# Patient Record
Sex: Female | Born: 1970 | Race: Black or African American | Hispanic: No | Marital: Married | Smoking: Never smoker
Health system: Southern US, Community
[De-identification: ages and names within clinical notes are randomized; demographics above are authoritative.]

## PROBLEM LIST (undated history)

## (undated) ENCOUNTER — Inpatient Hospital Stay (HOSPITAL_COMMUNITY): Payer: Self-pay

## (undated) DIAGNOSIS — I10 Essential (primary) hypertension: Secondary | ICD-10-CM

## (undated) DIAGNOSIS — K219 Gastro-esophageal reflux disease without esophagitis: Secondary | ICD-10-CM

## (undated) DIAGNOSIS — Z789 Other specified health status: Secondary | ICD-10-CM

## (undated) HISTORY — DX: Essential (primary) hypertension: I10

## (undated) HISTORY — DX: Gastro-esophageal reflux disease without esophagitis: K21.9

---

## 2015-06-25 ENCOUNTER — Other Ambulatory Visit (HOSPITAL_COMMUNITY): Payer: Self-pay | Admitting: Nurse Practitioner

## 2015-06-25 DIAGNOSIS — O09522 Supervision of elderly multigravida, second trimester: Secondary | ICD-10-CM

## 2015-06-25 DIAGNOSIS — Z3689 Encounter for other specified antenatal screening: Secondary | ICD-10-CM

## 2015-06-25 DIAGNOSIS — Z3A18 18 weeks gestation of pregnancy: Secondary | ICD-10-CM

## 2015-06-25 LAB — OB RESULTS CONSOLE RPR: RPR: NONREACTIVE

## 2015-06-25 LAB — OB RESULTS CONSOLE HEPATITIS B SURFACE ANTIGEN: HEP B S AG: NEGATIVE

## 2015-06-25 LAB — OB RESULTS CONSOLE GC/CHLAMYDIA
CHLAMYDIA, DNA PROBE: NEGATIVE
Gonorrhea: NEGATIVE

## 2015-06-25 LAB — OB RESULTS CONSOLE ABO/RH: RH Type: POSITIVE

## 2015-06-25 LAB — OB RESULTS CONSOLE RUBELLA ANTIBODY, IGM: RUBELLA: IMMUNE

## 2015-06-25 LAB — OB RESULTS CONSOLE HIV ANTIBODY (ROUTINE TESTING): HIV: NONREACTIVE

## 2015-06-25 LAB — OB RESULTS CONSOLE ANTIBODY SCREEN: ANTIBODY SCREEN: NEGATIVE

## 2015-07-07 ENCOUNTER — Ambulatory Visit (HOSPITAL_COMMUNITY): Payer: Self-pay

## 2015-07-14 ENCOUNTER — Other Ambulatory Visit (HOSPITAL_COMMUNITY): Payer: Self-pay | Admitting: Nurse Practitioner

## 2015-07-14 DIAGNOSIS — O09522 Supervision of elderly multigravida, second trimester: Secondary | ICD-10-CM

## 2015-07-14 DIAGNOSIS — Z3A18 18 weeks gestation of pregnancy: Secondary | ICD-10-CM

## 2015-07-14 DIAGNOSIS — Z3689 Encounter for other specified antenatal screening: Secondary | ICD-10-CM

## 2015-07-17 ENCOUNTER — Ambulatory Visit (HOSPITAL_COMMUNITY)
Admission: RE | Admit: 2015-07-17 | Discharge: 2015-07-17 | Disposition: A | Discharge status: Home / Self Care | Payer: 59 | Source: Ambulatory Visit | Attending: Nurse Practitioner | Admitting: Nurse Practitioner

## 2015-07-17 ENCOUNTER — Other Ambulatory Visit (HOSPITAL_COMMUNITY): Payer: Self-pay | Admitting: Nurse Practitioner

## 2015-07-17 ENCOUNTER — Encounter (HOSPITAL_COMMUNITY): Payer: Self-pay

## 2015-07-17 DIAGNOSIS — Z3A21 21 weeks gestation of pregnancy: Secondary | ICD-10-CM | POA: Insufficient documentation

## 2015-07-17 DIAGNOSIS — O09529 Supervision of elderly multigravida, unspecified trimester: Secondary | ICD-10-CM

## 2015-07-17 DIAGNOSIS — Z3492 Encounter for supervision of normal pregnancy, unspecified, second trimester: Secondary | ICD-10-CM

## 2015-07-17 DIAGNOSIS — O09522 Supervision of elderly multigravida, second trimester: Secondary | ICD-10-CM | POA: Insufficient documentation

## 2015-07-17 DIAGNOSIS — Z3A18 18 weeks gestation of pregnancy: Secondary | ICD-10-CM

## 2015-07-17 DIAGNOSIS — Z3689 Encounter for other specified antenatal screening: Secondary | ICD-10-CM

## 2015-07-17 DIAGNOSIS — O34219 Maternal care for unspecified type scar from previous cesarean delivery: Secondary | ICD-10-CM

## 2015-07-17 DIAGNOSIS — O280 Abnormal hematological finding on antenatal screening of mother: Secondary | ICD-10-CM | POA: Insufficient documentation

## 2015-07-17 DIAGNOSIS — Z315 Encounter for genetic counseling: Secondary | ICD-10-CM | POA: Diagnosis not present

## 2015-07-17 NOTE — Progress Notes (Signed)
Genetic Counseling  High-Risk Gestation Note  Appointment Date:  07/17/2015 Referred By: Alberteen Spindle, NP Date of Birth:  1971/03/15 Partner:  Jenna Greene   Pregnancy History: [id number] Estimated Date of Delivery: 11/25/15 Estimated Gestational Age: [redacted]w[redacted]d Attending: Ledon Snare, MD   Ms. Jenna Greene and her husband, Mr. Jenna Greene, were seen for genetic counseling because of a maternal age of 44 y.o..   Mr. Jenna Greene assisted with interpretation during today's visit, but Jenna Greene indicated that she understood English better than she could speak it.   In Summary:  Discussed Advanced maternal age and increased risk for fetal aneuploidy  Detailed ultrasound performed today changed EDD to 11/25/15  Patient previously had Quad screen, which was recalculated using corrected EDD; Result now screen positive for Down syndrome (1 in 148) but decreased from patient's age-risk  Visualized fetal anatomy reportedly normal on today's ultrasound  Patient declined NIPS and amniocentesis  They were counseled regarding maternal age and the association with risk for chromosome conditions due to nondisjunction with aging of the ova.   We reviewed chromosomes, nondisjunction, and the associated 1 in 22 risk for fetal aneuploidy related to a maternal age of 44 y.o. at [redacted]w[redacted]d gestation.  They were counseled that the risk for aneuploidy decreases as gestational age increases, accounting for those pregnancies which spontaneously abort.  We specifically discussed Down syndrome (trisomy 80), trisomies 70 and 42, and sex chromosome aneuploidies (47,XXX and 47,XXY) including the common features and prognoses of each.   Detailed ultrasound was performed at the time of today's visit. Visualized fetal anatomy appeared normal. The patient's EDD was changed to 11/25/15 based on today's ultrasound exam. Complete ultrasound results reported separately. We reviewed the benefits and limitations of ultrasound as a screening tool  for fetal aneuploidy. They understand that ultrasound cannot diagnose or rule out chromosome conditions and does not identify or rule out all birth defects.   Jenna Greene previously had Quad screening performed through [facility], facilitated through her OB provider. The screen was recalculated today with the changed estimated date of delivery, and the result was reported as screen positive for Down syndrome. However, the adjusted risk is still less than the patient's age-related risk (1 in 42 to 1 in 148). We reviewed that screening tests are used to modify a patient's a priori risk for aneuploidy, typically based on age. This estimate provides a pregnancy specific risk assessment. They understand that this screen is not diagnostic for these conditions and does not assess for all chromosome conditions.   We reviewed the additional available screening option of noninvasive prenatal screening (NIPS)/cell free DNA (cfDNA) testing.  They were counseled that screening tests are used to modify a patient's a priori risk for aneuploidy, typically based on age. This estimate provides a pregnancy specific risk assessment. We reviewed the benefits and limitations of each option. Specifically, we discussed the conditions for which each test screens, the detection rates, and false positive rates of each. They were also counseled regarding diagnostic testing via amniocentesis. We reviewed the approximate 1 in 300-500 risk for complications for amniocentesis, including spontaneous pregnancy loss. After consideration of all the options, they declined NIPS and amniocentesis, stating they were comfortable with the risk assessment from ultrasound and Quad screen. They understand that screening tests cannot rule out all birth defects or genetic syndromes. The patient was advised of this limitation and states she still does not want additional testing at this time.   Jenna Greene was provided  with written  information regarding sickle cell anemia (SCA) including the carrier frequency and incidence in the African population, the availability of carrier testing and prenatal diagnosis if indicated.  In addition, we discussed that hemoglobinopathies are routinely screened for as part of the [facility] newborn screening panel.  Hemoglobin electrophoresis was previously performed and was normal through her OB provider.  Both family histories were reviewed and found to be noncontributory for birth defects, intellectual disability, and known genetic conditions. Without further information regarding the provided family history, an accurate genetic risk cannot be calculated. Further genetic counseling is warranted if more information is obtained.  Jenna Greene denied exposure to environmental toxins or chemical agents. She denied the use of alcohol, tobacco or street drugs. She denied significant viral illnesses during the course of her pregnancy. Her medical and surgical histories were noncontributory.   I counseled this couple regarding the above risks and available options.  The approximate face-to-face time with the genetic counselor was 40 minutes.  Quinn Plowman, MS,  Certified Genetic Counselor 07/17/2015

## 2015-07-31 ENCOUNTER — Ambulatory Visit
Admission: RE | Admit: 2015-07-31 | Discharge: 2015-07-31 | Disposition: A | Discharge status: Home / Self Care | Payer: No Typology Code available for payment source | Source: Ambulatory Visit | Attending: Infectious Disease | Admitting: Infectious Disease

## 2015-07-31 ENCOUNTER — Other Ambulatory Visit: Payer: Self-pay | Admitting: Infectious Disease

## 2015-07-31 DIAGNOSIS — R7611 Nonspecific reaction to tuberculin skin test without active tuberculosis: Secondary | ICD-10-CM

## 2015-08-21 ENCOUNTER — Other Ambulatory Visit (HOSPITAL_COMMUNITY): Payer: Self-pay | Admitting: Physician Assistant

## 2015-08-21 DIAGNOSIS — O09523 Supervision of elderly multigravida, third trimester: Secondary | ICD-10-CM

## 2015-08-21 DIAGNOSIS — Z3A28 28 weeks gestation of pregnancy: Secondary | ICD-10-CM

## 2015-09-04 ENCOUNTER — Ambulatory Visit (HOSPITAL_COMMUNITY)
Admission: RE | Admit: 2015-09-04 | Discharge: 2015-09-04 | Disposition: A | Discharge status: Home / Self Care | Payer: 59 | Source: Ambulatory Visit | Attending: Physician Assistant | Admitting: Physician Assistant

## 2015-09-04 ENCOUNTER — Other Ambulatory Visit (HOSPITAL_COMMUNITY): Payer: Self-pay | Admitting: Physician Assistant

## 2015-09-04 VITALS — BP 117/74 | HR 90 | Wt 210.0 lb

## 2015-09-04 DIAGNOSIS — O34219 Maternal care for unspecified type scar from previous cesarean delivery: Secondary | ICD-10-CM

## 2015-09-04 DIAGNOSIS — Z3A28 28 weeks gestation of pregnancy: Secondary | ICD-10-CM

## 2015-09-04 DIAGNOSIS — O09529 Supervision of elderly multigravida, unspecified trimester: Secondary | ICD-10-CM

## 2015-09-04 DIAGNOSIS — O09523 Supervision of elderly multigravida, third trimester: Secondary | ICD-10-CM

## 2015-09-04 DIAGNOSIS — O3421 Maternal care for scar from previous cesarean delivery: Secondary | ICD-10-CM | POA: Insufficient documentation

## 2015-09-22 ENCOUNTER — Encounter (HOSPITAL_COMMUNITY): Payer: Self-pay | Admitting: *Deleted

## 2015-09-28 ENCOUNTER — Encounter: Payer: Self-pay | Admitting: *Deleted

## 2015-09-30 ENCOUNTER — Ambulatory Visit (HOSPITAL_COMMUNITY)
Admission: RE | Admit: 2015-09-30 | Discharge: 2015-09-30 | Disposition: A | Discharge status: Home / Self Care | Payer: 59 | Source: Ambulatory Visit | Attending: Physician Assistant | Admitting: Physician Assistant

## 2015-09-30 ENCOUNTER — Encounter (HOSPITAL_COMMUNITY): Payer: Self-pay

## 2015-09-30 ENCOUNTER — Other Ambulatory Visit (HOSPITAL_COMMUNITY): Payer: Self-pay | Admitting: Maternal and Fetal Medicine

## 2015-09-30 DIAGNOSIS — Z3A32 32 weeks gestation of pregnancy: Secondary | ICD-10-CM

## 2015-09-30 DIAGNOSIS — O34219 Maternal care for unspecified type scar from previous cesarean delivery: Secondary | ICD-10-CM | POA: Diagnosis not present

## 2015-09-30 DIAGNOSIS — Z36 Encounter for antenatal screening of mother: Secondary | ICD-10-CM | POA: Insufficient documentation

## 2015-09-30 DIAGNOSIS — O289 Unspecified abnormal findings on antenatal screening of mother: Secondary | ICD-10-CM | POA: Diagnosis present

## 2015-09-30 DIAGNOSIS — O09523 Supervision of elderly multigravida, third trimester: Secondary | ICD-10-CM

## 2015-09-30 DIAGNOSIS — O09529 Supervision of elderly multigravida, unspecified trimester: Secondary | ICD-10-CM

## 2015-10-26 ENCOUNTER — Inpatient Hospital Stay (HOSPITAL_COMMUNITY)
Admission: AD | Admit: 2015-10-26 | Discharge: 2015-10-26 | Disposition: A | Discharge status: Home / Self Care | Payer: 59 | Source: Ambulatory Visit | Attending: Family Medicine | Admitting: Family Medicine

## 2015-10-26 ENCOUNTER — Encounter (HOSPITAL_COMMUNITY): Payer: Self-pay | Admitting: *Deleted

## 2015-10-26 DIAGNOSIS — O368130 Decreased fetal movements, third trimester, not applicable or unspecified: Secondary | ICD-10-CM | POA: Diagnosis not present

## 2015-10-26 DIAGNOSIS — O09523 Supervision of elderly multigravida, third trimester: Secondary | ICD-10-CM

## 2015-10-26 DIAGNOSIS — Z3A25 25 weeks gestation of pregnancy: Secondary | ICD-10-CM | POA: Diagnosis not present

## 2015-10-26 HISTORY — DX: Other specified health status: Z78.9

## 2015-10-26 NOTE — Discharge Instructions (Signed)
Fetal Movement Counts  Patient Name: __________________________________________________ Patient Due Date: ____________________  Performing a fetal movement count is highly recommended in high-risk pregnancies, but it is good for every pregnant woman to do. Your health care provider may ask you to start counting fetal movements at 28 weeks of the pregnancy. Fetal movements often increase:  · After eating a full meal.  · After physical activity.  · After eating or drinking something sweet or cold.  · At rest.  Pay attention to when you feel the baby is most active. This will help you notice a pattern of your baby's sleep and wake cycles and what factors contribute to an increase in fetal movement. It is important to perform a fetal movement count at the same time each day when your baby is normally most active.   HOW TO COUNT FETAL MOVEMENTS  1. Find a quiet and comfortable area to sit or lie down on your left side. Lying on your left side provides the best blood and oxygen circulation to your baby.  2. Write down the day and time on a sheet of paper or in a journal.  3. Start counting kicks, flutters, swishes, rolls, or jabs in a 2-hour period. You should feel at least 10 movements within 2 hours.  4. If you do not feel 10 movements in 2 hours, wait 2-3 hours and count again. Look for a change in the pattern or not enough counts in 2 hours.  SEEK MEDICAL CARE IF:  · You feel less than 10 counts in 2 hours, tried twice.  · There is no movement in over an hour.  · The pattern is changing or taking longer each day to reach 10 counts in 2 hours.  · You feel the baby is not moving as he or she usually does.  Date: ____________ Movements: ____________ Start time: ____________ Finish time: ____________   Date: ____________ Movements: ____________ Start time: ____________ Finish time: ____________  Date: ____________ Movements: ____________ Start time: ____________ Finish time: ____________  Date: ____________ Movements:  ____________ Start time: ____________ Finish time: ____________  Date: ____________ Movements: ____________ Start time: ____________ Finish time: ____________  Date: ____________ Movements: ____________ Start time: ____________ Finish time: ____________  Date: ____________ Movements: ____________ Start time: ____________ Finish time: ____________  Date: ____________ Movements: ____________ Start time: ____________ Finish time: ____________   Date: ____________ Movements: ____________ Start time: ____________ Finish time: ____________  Date: ____________ Movements: ____________ Start time: ____________ Finish time: ____________  Date: ____________ Movements: ____________ Start time: ____________ Finish time: ____________  Date: ____________ Movements: ____________ Start time: ____________ Finish time: ____________  Date: ____________ Movements: ____________ Start time: ____________ Finish time: ____________  Date: ____________ Movements: ____________ Start time: ____________ Finish time: ____________  Date: ____________ Movements: ____________ Start time: ____________ Finish time: ____________   Date: ____________ Movements: ____________ Start time: ____________ Finish time: ____________  Date: ____________ Movements: ____________ Start time: ____________ Finish time: ____________  Date: ____________ Movements: ____________ Start time: ____________ Finish time: ____________  Date: ____________ Movements: ____________ Start time: ____________ Finish time: ____________  Date: ____________ Movements: ____________ Start time: ____________ Finish time: ____________  Date: ____________ Movements: ____________ Start time: ____________ Finish time: ____________  Date: ____________ Movements: ____________ Start time: ____________ Finish time: ____________   Date: ____________ Movements: ____________ Start time: ____________ Finish time: ____________  Date: ____________ Movements: ____________ Start time: ____________ Finish  time: ____________  Date: ____________ Movements: ____________ Start time: ____________ Finish time: ____________  Date: ____________ Movements: ____________ Start time:   ____________ Finish time: ____________  Date: ____________ Movements: ____________ Start time: ____________ Finish time: ____________  Date: ____________ Movements: ____________ Start time: ____________ Finish time: ____________  Date: ____________ Movements: ____________ Start time: ____________ Finish time: ____________   Date: ____________ Movements: ____________ Start time: ____________ Finish time: ____________  Date: ____________ Movements: ____________ Start time: ____________ Finish time: ____________  Date: ____________ Movements: ____________ Start time: ____________ Finish time: ____________  Date: ____________ Movements: ____________ Start time: ____________ Finish time: ____________  Date: ____________ Movements: ____________ Start time: ____________ Finish time: ____________  Date: ____________ Movements: ____________ Start time: ____________ Finish time: ____________  Date: ____________ Movements: ____________ Start time: ____________ Finish time: ____________   Date: ____________ Movements: ____________ Start time: ____________ Finish time: ____________  Date: ____________ Movements: ____________ Start time: ____________ Finish time: ____________  Date: ____________ Movements: ____________ Start time: ____________ Finish time: ____________  Date: ____________ Movements: ____________ Start time: ____________ Finish time: ____________  Date: ____________ Movements: ____________ Start time: ____________ Finish time: ____________  Date: ____________ Movements: ____________ Start time: ____________ Finish time: ____________  Date: ____________ Movements: ____________ Start time: ____________ Finish time: ____________   Date: ____________ Movements: ____________ Start time: ____________ Finish time: ____________  Date: ____________  Movements: ____________ Start time: ____________ Finish time: ____________  Date: ____________ Movements: ____________ Start time: ____________ Finish time: ____________  Date: ____________ Movements: ____________ Start time: ____________ Finish time: ____________  Date: ____________ Movements: ____________ Start time: ____________ Finish time: ____________  Date: ____________ Movements: ____________ Start time: ____________ Finish time: ____________  Date: ____________ Movements: ____________ Start time: ____________ Finish time: ____________   Date: ____________ Movements: ____________ Start time: ____________ Finish time: ____________  Date: ____________ Movements: ____________ Start time: ____________ Finish time: ____________  Date: ____________ Movements: ____________ Start time: ____________ Finish time: ____________  Date: ____________ Movements: ____________ Start time: ____________ Finish time: ____________  Date: ____________ Movements: ____________ Start time: ____________ Finish time: ____________  Date: ____________ Movements: ____________ Start time: ____________ Finish time: ____________     This information is not intended to replace advice given to you by your health care provider. Make sure you discuss any questions you have with your health care provider.     Document Released: 12/21/2006 Document Revised: 12/12/2014 Document Reviewed: 09/17/2012  Elsevier Interactive Patient Education ©2016 [employer]

## 2015-10-26 NOTE — MAU Note (Addendum)
Patient sent by [facility] at [redacted] weeks gestation following a nonreactive NST in the office; Fetus active; denies pain, bleeding or discharge. BPP= 8/8.

## 2015-10-26 NOTE — MAU Provider Note (Signed)
  History     CSN: [id number]  Arrival date and time: 10/26/15 1210   First Provider Initiated Contact with Patient 10/26/15 1258      Chief Complaint  Patient presents with  . Non-stress Test   HPI Patient states she was a the [facility] today, and they did an NST that was non-reactive on the monitor. They then did a BPP which was 8/8  Patient denies any changes in fetal movement (+). Denies vaginal bleeding, no LOF, endorse braxton-hicks, no constant contractions. Uncomplicated pregnancy except for AMA (age 44).   OB History    Gravida Para Term Preterm AB TAB SAB Ectopic Multiple Living   4 3 3  0 0 0 0 0 0 3      Past Medical History  Diagnosis Date  . Medical history non-contributory     Past Surgical History  Procedure Laterality Date  . Cesarean section      History reviewed. No pertinent family history.  Social History  Substance Use Topics  . Smoking status: Never Smoker   . Smokeless tobacco: None  . Alcohol Use: No    Allergies: No Known Allergies  Prescriptions prior to admission  Medication Sig Dispense Refill Last Dose  . Prenatal Vit-Fe Fumarate-FA (PRENATAL MULTIVITAMIN) TABS tablet Take 1 tablet by mouth daily at 12 noon.       Review of Systems  Constitutional: Negative for fever and chills.  Gastrointestinal: Negative for abdominal pain.       Positive FM.    Physical Exam   Blood pressure 130/81, pulse 108, temperature 97.4 F (36.3 C), temperature source Oral, resp. rate 18, height 5\' 4"  (1.626 m), weight 213 lb 3 oz (96.701 kg), last menstrual period 03/11/2015.  Physical Exam  Constitutional: She is oriented to person, place, and time. She appears well-developed and well-nourished.  Cardiovascular: Normal rate, regular rhythm, normal heart sounds and intact distal pulses.  Exam reveals no gallop and no friction rub.   No murmur heard. Respiratory: Effort normal and breath sounds normal. No respiratory distress. She has no wheezes.   GI: Soft. Bowel sounds are normal. She exhibits no distension. There is no tenderness.  Musculoskeletal: Normal range of motion. She exhibits no edema.  Neurological: She is alert and oriented to person, place, and time.  Skin: Skin is warm and dry.  FHT: FHR: 135-140 bpm, variability: moderate, accelerations: Present, Decelerations: None noted   MAU Course  Procedures   Assessment and Plan   NST: Patient's FHR tracing on monitor was reactive, accelerations x 3. No decelerations noted. No contractions on the monitor.  - Patient was watched for 20 minutes on monitors, reassuring strips - Patient was discharged with adequate follow up  - [facility] >40. In-basket message sent to Dr. Macon LargeAnyanwu.   Danella Maierssiyah Z Mikell 10/26/2015, 12:58 PM   I was present for the exam and agree with above.  [place] Tiwana Chavis, CNM 10/27/2015 4:51 PM

## 2015-10-30 ENCOUNTER — Encounter (HOSPITAL_COMMUNITY): Payer: Self-pay

## 2015-10-30 ENCOUNTER — Ambulatory Visit (HOSPITAL_COMMUNITY)
Admission: RE | Admit: 2015-10-30 | Discharge: 2015-10-30 | Disposition: A | Discharge status: Home / Self Care | Payer: 59 | Source: Ambulatory Visit | Attending: Physician Assistant | Admitting: Physician Assistant

## 2015-10-30 DIAGNOSIS — O368190 Decreased fetal movements, unspecified trimester, not applicable or unspecified: Secondary | ICD-10-CM | POA: Insufficient documentation

## 2015-10-30 DIAGNOSIS — Z3A00 Weeks of gestation of pregnancy not specified: Secondary | ICD-10-CM | POA: Insufficient documentation

## 2015-11-12 ENCOUNTER — Inpatient Hospital Stay (HOSPITAL_COMMUNITY)
Admission: AD | Admit: 2015-11-12 | Discharge: 2015-11-12 | Disposition: A | Discharge status: Home / Self Care | Payer: 59 | Source: Ambulatory Visit | Attending: Obstetrics and Gynecology | Admitting: Obstetrics and Gynecology

## 2015-11-12 ENCOUNTER — Inpatient Hospital Stay (HOSPITAL_COMMUNITY): Payer: 59

## 2015-11-12 ENCOUNTER — Encounter (HOSPITAL_COMMUNITY): Payer: Self-pay

## 2015-11-12 DIAGNOSIS — O26893 Other specified pregnancy related conditions, third trimester: Secondary | ICD-10-CM | POA: Insufficient documentation

## 2015-11-12 DIAGNOSIS — O09523 Supervision of elderly multigravida, third trimester: Secondary | ICD-10-CM | POA: Insufficient documentation

## 2015-11-12 DIAGNOSIS — Z3A38 38 weeks gestation of pregnancy: Secondary | ICD-10-CM

## 2015-11-12 DIAGNOSIS — IMO0002 Reserved for concepts with insufficient information to code with codable children: Secondary | ICD-10-CM

## 2015-11-12 DIAGNOSIS — O288 Other abnormal findings on antenatal screening of mother: Secondary | ICD-10-CM

## 2015-11-12 NOTE — Discharge Instructions (Signed)
Fetal Movement Counts Patient Name: __________________________________________________ Patient Due Date: ____________________ Performing a fetal movement count is highly recommended in high-risk pregnancies, but it is good for every pregnant woman to do. Your health care provider may ask you to start counting fetal movements at 28 weeks of the pregnancy. Fetal movements often increase:  After eating a full meal.  After physical activity.  After eating or drinking something sweet or cold.  At rest. Pay attention to when you feel the baby is most active. This will help you notice a pattern of your baby's sleep and wake cycles and what factors contribute to an increase in fetal movement. It is important to perform a fetal movement count at the same time each day when your baby is normally most active.  HOW TO COUNT FETAL MOVEMENTS 1. Find a quiet and comfortable area to sit or lie down on your left side. Lying on your left side provides the best blood and oxygen circulation to your baby. 2. Write down the day and time on a sheet of paper or in a journal. 3. Start counting kicks, flutters, swishes, rolls, or jabs in a 2-hour period. You should feel at least 10 movements within 2 hours. 4. If you do not feel 10 movements in 2 hours, wait 2-3 hours and count again. Look for a change in the pattern or not enough counts in 2 hours. SEEK MEDICAL CARE IF:  You feel less than 10 counts in 2 hours, tried twice.  There is no movement in over an hour.  The pattern is changing or taking longer each day to reach 10 counts in 2 hours.  You feel the baby is not moving as he or she usually does. Date: ____________ Movements: ____________ Start time: ____________ Finish time: ____________  Date: ____________ Movements: ____________ Start time: ____________ Finish time: ____________ Date: ____________ Movements: ____________ Start time: ____________ Finish time: ____________ Date: ____________ Movements:  ____________ Start time: ____________ Finish time: ____________ Date: ____________ Movements: ____________ Start time: ____________ Finish time: ____________ Date: ____________ Movements: ____________ Start time: ____________ Finish time: ____________ Date: ____________ Movements: ____________ Start time: ____________ Finish time: ____________ Date: ____________ Movements: ____________ Start time: ____________ Finish time: ____________  Date: ____________ Movements: ____________ Start time: ____________ Finish time: ____________ Date: ____________ Movements: ____________ Start time: ____________ Finish time: ____________ Date: ____________ Movements: ____________ Start time: ____________ Finish time: ____________ Date: ____________ Movements: ____________ Start time: ____________ Finish time: ____________ Date: ____________ Movements: ____________ Start time: ____________ Finish time: ____________ Date: ____________ Movements: ____________ Start time: ____________ Finish time: ____________ Date: ____________ Movements: ____________ Start time: ____________ Finish time: ____________  Date: ____________ Movements: ____________ Start time: ____________ Finish time: ____________ Date: ____________ Movements: ____________ Start time: ____________ Finish time: ____________ Date: ____________ Movements: ____________ Start time: ____________ Finish time: ____________ Date: ____________ Movements: ____________ Start time: ____________ Finish time: ____________ Date: ____________ Movements: ____________ Start time: ____________ Finish time: ____________ Date: ____________ Movements: ____________ Start time: ____________ Finish time: ____________ Date: ____________ Movements: ____________ Start time: ____________ Finish time: ____________  Date: ____________ Movements: ____________ Start time: ____________ Finish time: ____________ Date: ____________ Movements: ____________ Start time: ____________ Finish  time: ____________ Date: ____________ Movements: ____________ Start time: ____________ Finish time: ____________ Date: ____________ Movements: ____________ Start time: ____________ Finish time: ____________ Date: ____________ Movements: ____________ Start time: ____________ Finish time: ____________ Date: ____________ Movements: ____________ Start time: ____________ Finish time: ____________ Date: ____________ Movements: ____________ Start time: ____________ Finish time: ____________  Date: ____________ Movements: ____________ Start time: ____________ Finish   time: ____________ Date: ____________ Movements: ____________ Start time: ____________ Finish time: ____________ Date: ____________ Movements: ____________ Start time: ____________ Finish time: ____________ Date: ____________ Movements: ____________ Start time: ____________ Finish time: ____________ Date: ____________ Movements: ____________ Start time: ____________ Finish time: ____________ Date: ____________ Movements: ____________ Start time: ____________ Finish time: ____________ Date: ____________ Movements: ____________ Start time: ____________ Finish time: ____________  Date: ____________ Movements: ____________ Start time: ____________ Finish time: ____________ Date: ____________ Movements: ____________ Start time: ____________ Finish time: ____________ Date: ____________ Movements: ____________ Start time: ____________ Finish time: ____________ Date: ____________ Movements: ____________ Start time: ____________ Finish time: ____________ Date: ____________ Movements: ____________ Start time: ____________ Finish time: ____________ Date: ____________ Movements: ____________ Start time: ____________ Finish time: ____________ Date: ____________ Movements: ____________ Start time: ____________ Finish time: ____________  Date: ____________ Movements: ____________ Start time: ____________ Finish time: ____________ Date: ____________  Movements: ____________ Start time: ____________ Finish time: ____________ Date: ____________ Movements: ____________ Start time: ____________ Finish time: ____________ Date: ____________ Movements: ____________ Start time: ____________ Finish time: ____________ Date: ____________ Movements: ____________ Start time: ____________ Finish time: ____________ Date: ____________ Movements: ____________ Start time: ____________ Finish time: ____________ Date: ____________ Movements: ____________ Start time: ____________ Finish time: ____________  Date: ____________ Movements: ____________ Start time: ____________ Finish time: ____________ Date: ____________ Movements: ____________ Start time: ____________ Finish time: ____________ Date: ____________ Movements: ____________ Start time: ____________ Finish time: ____________ Date: ____________ Movements: ____________ Start time: ____________ Finish time: ____________ Date: ____________ Movements: ____________ Start time: ____________ Finish time: ____________ Date: ____________ Movements: ____________ Start time: ____________ Finish time: ____________   This information is not intended to replace advice given to you by your health care provider. Make sure you discuss any questions you have with your health care provider.   Document Released: 12/21/2006 Document Revised: 12/12/2014 Document Reviewed: 09/17/2012 Elsevier Interactive Patient Education 2016 [employer] Braxton Hicks Contractions Contractions of the uterus can occur throughout pregnancy. Contractions are not always a sign that you are in labor.  WHAT ARE BRAXTON HICKS CONTRACTIONS?  Contractions that occur before labor are called Braxton Hicks contractions, or false labor. Toward the end of pregnancy (32-34 weeks), these contractions can develop more often and may become more forceful. This is not true labor because these contractions do not result in opening (dilatation) and thinning of  the cervix. They are sometimes difficult to tell apart from true labor because these contractions can be forceful and people have different pain tolerances. You should not feel embarrassed if you go to the hospital with false labor. Sometimes, the only way to tell if you are in true labor is for your health care provider to look for changes in the cervix. If there are no prenatal problems or other health problems associated with the pregnancy, it is completely safe to be sent home with false labor and await the onset of true labor. HOW CAN YOU TELL THE DIFFERENCE BETWEEN TRUE AND FALSE LABOR? False Labor  The contractions of false labor are usually shorter and not as hard as those of true labor.   The contractions are usually irregular.   The contractions are often felt in the front of the lower abdomen and in the groin.   The contractions may go away when you walk around or change positions while lying down.   The contractions get weaker and are shorter lasting as time goes on.   The contractions do not usually become progressively stronger, regular, and closer together as with true labor.  True Labor 5. Contractions in true   labor last 30-70 seconds, become very regular, usually become more intense, and increase in frequency.  6. The contractions do not go away with walking.  7. The discomfort is usually felt in the top of the uterus and spreads to the lower abdomen and low back.  8. True labor can be determined by your health care provider with an exam. This will show that the cervix is dilating and getting thinner.  WHAT TO REMEMBER  Keep up with your usual exercises and follow other instructions given by your health care provider.   Take medicines as directed by your health care provider.   Keep your regular prenatal appointments.   Eat and drink lightly if you think you are going into labor.   If Braxton Hicks contractions are making you uncomfortable:   Change  your position from lying down or resting to walking, or from walking to resting.   Sit and rest in a tub of warm water.   Drink 2-3 glasses of water. Dehydration may cause these contractions.   Do slow and deep breathing several times an hour.  WHEN SHOULD I SEEK IMMEDIATE MEDICAL CARE? Seek immediate medical care if:  Your contractions become stronger, more regular, and closer together.   You have fluid leaking or gushing from your vagina.   You have a fever.   You pass blood-tinged mucus.   You have vaginal bleeding.   You have continuous abdominal pain.   You have low back pain that you never had before.   You feel your baby's head pushing down and causing pelvic pressure.   Your baby is not moving as much as it used to.    This information is not intended to replace advice given to you by your health care provider. Make sure you discuss any questions you have with your health care provider.   Document Released: 11/21/2005 Document Revised: 11/26/2013 Document Reviewed: 09/02/2013 Elsevier Interactive Patient Education 2016 Elsevier Inc[employer]  

## 2015-11-12 NOTE — MAU Note (Signed)
Urine in lab 

## 2015-11-12 NOTE — MAU Note (Signed)
[facility] because of a non reactive NST and deceleration. Denies pain or bleeding. Reports good fetal movement.

## 2015-11-12 NOTE — MAU Provider Note (Signed)
  History     CSN: [id number]  Arrival date and time: 11/12/15 1625   First Provider Initiated Contact with Patient 11/12/15 1717         Chief Complaint  Patient presents with  . Prolonged Monitoring    HPI Sanjuan Dameraba Rolston is a 44 y.o. [id number] at [id number] who [facility].  [facility] for NST d/t AMA. Prolonged deceleration during NST.  Patient report normal fetal movement. Denies contractions, LOF, or vaginal bleeding.  Scheduled for repeat c/section on 12/14.   OB History    Gravida Para Term Preterm AB TAB SAB Ectopic Multiple Living   4 3 3  0 0 0 0 0 0 3      Past Medical History  Diagnosis Date  . Medical history non-contributory     Past Surgical History  Procedure Laterality Date  . Cesarean section      History reviewed. No pertinent family history.  Social History  Substance Use Topics  . Smoking status: Never Smoker   . Smokeless tobacco: None  . Alcohol Use: No    Allergies:  Allergies  Allergen Reactions  . Pork-Derived Products Other (See Comments)    Prescriptions prior to admission  Medication Sig Dispense Refill Last Dose  . guaiFENesin (ROBITUSSIN) 100 MG/5ML liquid Take 200 mg by mouth 4 (four) times daily as needed for cough.   11/12/2015 at Unknown time  . Prenatal Vit-Fe Fumarate-FA (PRENATAL MULTIVITAMIN) TABS tablet Take 1 tablet by mouth daily at 12 noon.   11/12/2015 at Unknown time  . pseudoephedrine (SUDAFED) 30 MG tablet Take 30-60 mg by mouth every 6 (six) hours as needed for congestion.   11/12/2015 at Unknown time    Review of Systems  Constitutional: Negative.   Gastrointestinal: Negative.   Genitourinary: Negative.    Physical Exam   Blood pressure 116/80, pulse 95, temperature 98 F (36.7 C), temperature source Oral, resp. rate 18, last menstrual period 03/11/2015.  Physical Exam  Constitutional: She appears well-developed and well-nourished. No distress.  HENT:   Head: Normocephalic and atraumatic.  Respiratory: Effort normal. No respiratory distress.  GI: Soft. There is no tenderness.  Skin: She is not diaphoretic.  Psychiatric: She has a normal mood and affect. Her behavior is normal. Judgment and thought content normal.   Fetal Tracing:  Baseline: 135 Variability: moderate Accelerations: 10x10, 15x15 prior to being taken off monitor for discharge Decelerations: none  Toco: irregular   MAU Course  Procedures  MDM Category 1 tracing without decelerations Ultrasound - BPP 8/8, AFI 11 S/w Dr. Jolayne Pantheronstant about BPP & FHT. Ok to discharge home  Assessment and Plan  A: 1. Fetal heart deceleration   2. Non-reactive NST (non-stress test)    P: Discharge home Keep scheduled follow up Discussed reasons to return to MAU Fetal kick count form given  Judeth HornErin Sanoe Hazan, NP  11/12/2015, 4:47 PM

## 2015-11-16 ENCOUNTER — Other Ambulatory Visit (HOSPITAL_COMMUNITY): Payer: 59

## 2015-11-16 ENCOUNTER — Encounter (HOSPITAL_COMMUNITY)
Admission: RE | Admit: 2015-11-16 | Discharge: 2015-11-16 | Disposition: A | Discharge status: Home / Self Care | Payer: 59 | Source: Ambulatory Visit | Attending: Obstetrics & Gynecology | Admitting: Obstetrics & Gynecology

## 2015-11-16 ENCOUNTER — Encounter (HOSPITAL_COMMUNITY): Payer: Self-pay

## 2015-11-16 VITALS — BP 122/80 | HR 102 | Temp 99.0°F | Resp 20 | Ht 63.5 in | Wt 212.0 lb

## 2015-11-16 DIAGNOSIS — O280 Abnormal hematological finding on antenatal screening of mother: Secondary | ICD-10-CM

## 2015-11-16 DIAGNOSIS — O09529 Supervision of elderly multigravida, unspecified trimester: Secondary | ICD-10-CM

## 2015-11-16 LAB — ABO/RH: ABO/RH(D): O POS

## 2015-11-16 LAB — CBC
HCT: 34.5 % — ABNORMAL LOW (ref 36.0–46.0)
Hemoglobin: 11.8 g/dL — ABNORMAL LOW (ref 12.0–15.0)
MCH: 32.1 pg (ref 26.0–34.0)
MCHC: 34.2 g/dL (ref 30.0–36.0)
MCV: 93.8 fL (ref 78.0–100.0)
PLATELETS: 221 10*3/uL (ref 150–400)
RBC: 3.68 MIL/uL — AB (ref 3.87–5.11)
RDW: 13.8 % (ref 11.5–15.5)
WBC: 5.9 10*3/uL (ref 4.0–10.5)

## 2015-11-16 NOTE — Patient Instructions (Signed)
Your procedure is scheduled on:  Wednesday, Dec.  14, 2016  [facility]: 8:45 A.M.  Pick up the phone at the desk and dial [phone].  Call this number if you have problems the morning of surgery: [phone].  Remember: Do NOT eat food or drink after:  Midnight Tuesday Take these medicines the morning of surgery with a SIP OF WATER:  None  Do NOT wear jewelry (body piercing), metal hair clips/bobby pins, or nail polish. Do NOT wear lotions, powders, or perfumes.  You may wear deoderant. Do NOT shave for 48 hours prior to surgery. Do NOT bring valuables to the hospital.  Leave suitcase in car.  After surgery it may be brought to your room.  For patients admitted to the hospital, checkout time is 11:00 AM the day of discharge.

## 2015-11-17 ENCOUNTER — Encounter (HOSPITAL_COMMUNITY): Payer: Self-pay | Admitting: Anesthesiology

## 2015-11-17 LAB — RPR: RPR: NONREACTIVE

## 2015-11-17 NOTE — Anesthesia Preprocedure Evaluation (Addendum)
Anesthesia Evaluation  Patient identified by MRN, date of birth, ID band Patient awake    Reviewed: Allergy & Precautions, NPO status , Patient's Chart, lab work & pertinent test results  Airway Mallampati: III  TM Distance: >3 FB Neck ROM: Full    Dental no notable dental hx. (+) Teeth Intact   Pulmonary neg pulmonary ROS,    Pulmonary exam normal breath sounds clear to auscultation       Cardiovascular negative cardio ROS Normal cardiovascular exam Rhythm:Regular Rate:Normal     Neuro/Psych negative neurological ROS  negative psych ROS   GI/Hepatic Neg liver ROS, GERD  Medicated,  Endo/Other  Obesity  Renal/GU negative Renal ROS     Musculoskeletal negative musculoskeletal ROS (+)   Abdominal (+) + obese,   Peds  Hematology  (+) anemia ,   Anesthesia Other Findings   Reproductive/Obstetrics (+) Pregnancy Previous C/Section x 3 Undesired fertility                            Anesthesia Physical Anesthesia Plan  ASA: II  Anesthesia Plan: Combined Spinal and Epidural   Post-op Pain Management:    Induction:   Airway Management Planned: Natural Airway  Additional Equipment:   Intra-op Plan:   Post-operative Plan:   Informed Consent: I have reviewed the patients History and Physical, chart, labs and discussed the procedure including the risks, benefits and alternatives for the proposed anesthesia with the patient or authorized representative who has indicated his/her understanding and acceptance.     Plan Discussed with: Anesthesiologist, CRNA and Surgeon  Anesthesia Plan Comments:        Anesthesia Quick Evaluation

## 2015-11-18 ENCOUNTER — Encounter (HOSPITAL_COMMUNITY): Payer: Self-pay | Admitting: Anesthesiology

## 2015-11-18 ENCOUNTER — Encounter (HOSPITAL_COMMUNITY)
Admission: AD | Disposition: A | Discharge status: Home / Self Care | Payer: Self-pay | Source: Ambulatory Visit | Attending: Obstetrics & Gynecology

## 2015-11-18 ENCOUNTER — Inpatient Hospital Stay (HOSPITAL_COMMUNITY): Payer: 59 | Admitting: Anesthesiology

## 2015-11-18 ENCOUNTER — Encounter (HOSPITAL_COMMUNITY): Payer: Self-pay | Admitting: Obstetrics & Gynecology

## 2015-11-18 ENCOUNTER — Inpatient Hospital Stay (HOSPITAL_COMMUNITY)
Admission: AD | Admit: 2015-11-18 | Discharge: 2015-11-20 | DRG: 766 | Disposition: A | Discharge status: Home / Self Care | Payer: 59 | Source: Ambulatory Visit | Attending: Obstetrics & Gynecology | Admitting: Obstetrics & Gynecology

## 2015-11-18 DIAGNOSIS — Z302 Encounter for sterilization: Secondary | ICD-10-CM

## 2015-11-18 DIAGNOSIS — O9962 Diseases of the digestive system complicating childbirth: Secondary | ICD-10-CM | POA: Diagnosis not present

## 2015-11-18 DIAGNOSIS — O09523 Supervision of elderly multigravida, third trimester: Secondary | ICD-10-CM

## 2015-11-18 DIAGNOSIS — Z3A39 39 weeks gestation of pregnancy: Secondary | ICD-10-CM

## 2015-11-18 DIAGNOSIS — O34211 Maternal care for low transverse scar from previous cesarean delivery: Principal | ICD-10-CM | POA: Diagnosis present

## 2015-11-18 DIAGNOSIS — Z98891 History of uterine scar from previous surgery: Secondary | ICD-10-CM

## 2015-11-18 DIAGNOSIS — O280 Abnormal hematological finding on antenatal screening of mother: Secondary | ICD-10-CM

## 2015-11-18 DIAGNOSIS — K219 Gastro-esophageal reflux disease without esophagitis: Secondary | ICD-10-CM | POA: Diagnosis present

## 2015-11-18 DIAGNOSIS — O34219 Maternal care for unspecified type scar from previous cesarean delivery: Secondary | ICD-10-CM | POA: Diagnosis present

## 2015-11-18 HISTORY — PX: TUBAL LIGATION: SHX77

## 2015-11-18 LAB — CBC
HEMATOCRIT: 30.5 % — AB (ref 36.0–46.0)
HEMOGLOBIN: 10.4 g/dL — AB (ref 12.0–15.0)
MCH: 32.2 pg (ref 26.0–34.0)
MCHC: 34.1 g/dL (ref 30.0–36.0)
MCV: 94.4 fL (ref 78.0–100.0)
Platelets: 189 10*3/uL (ref 150–400)
RBC: 3.23 MIL/uL — AB (ref 3.87–5.11)
RDW: 13.8 % (ref 11.5–15.5)
WBC: 10.5 10*3/uL (ref 4.0–10.5)

## 2015-11-18 LAB — HEPATITIS B SURFACE ANTIGEN: Hepatitis B Surface Ag: NEGATIVE

## 2015-11-18 LAB — PREPARE RBC (CROSSMATCH)

## 2015-11-18 SURGERY — Surgical Case
Anesthesia: Spinal | Site: Abdomen

## 2015-11-18 MED ORDER — NALOXONE HCL 0.4 MG/ML IJ SOLN: 0.4000 mg | INTRAMUSCULAR | Status: DC | PRN

## 2015-11-18 MED ORDER — MEASLES, MUMPS & RUBELLA VAC ~~LOC~~ INJ: 0.5000 mL | INJECTION | Freq: Once | SUBCUTANEOUS | Status: DC

## 2015-11-18 MED ORDER — CITRIC ACID-SODIUM CITRATE 334-500 MG/5ML PO SOLN: 30.0000 mL | ORAL | Status: DC

## 2015-11-18 MED ORDER — MIDAZOLAM HCL 5 MG/5ML IJ SOLN
INTRAMUSCULAR | Status: DC | PRN
  Administered 2015-11-18 (×2): 1 mg via INTRAVENOUS

## 2015-11-18 MED ORDER — DIBUCAINE 1 % RE OINT: 1.0000 "application " | TOPICAL_OINTMENT | RECTAL | Status: DC | PRN

## 2015-11-18 MED ORDER — SCOPOLAMINE 1 MG/3DAYS TD PT72
MEDICATED_PATCH | TRANSDERMAL | Status: AC
  Administered 2015-11-18: 1.5 mg via TRANSDERMAL
  Filled 2015-11-18: qty 1

## 2015-11-18 MED ORDER — ZOLPIDEM TARTRATE 5 MG PO TABS: 5.0000 mg | ORAL_TABLET | Freq: Every evening | ORAL | Status: DC | PRN

## 2015-11-18 MED ORDER — SODIUM BICARBONATE 8.4 % IV SOLN
INTRAVENOUS | Status: DC | PRN
  Administered 2015-11-18: 5 mL via EPIDURAL
  Administered 2015-11-18: 10 mL via EPIDURAL

## 2015-11-18 MED ORDER — TETANUS-DIPHTH-ACELL PERTUSSIS 5-2.5-18.5 LF-MCG/0.5 IM SUSP: 0.5000 mL | Freq: Once | INTRAMUSCULAR | Status: DC

## 2015-11-18 MED ORDER — FENTANYL CITRATE (PF) 100 MCG/2ML IJ SOLN
INTRAMUSCULAR | Status: AC
  Filled 2015-11-18: qty 2

## 2015-11-18 MED ORDER — ONDANSETRON HCL 4 MG/2ML IJ SOLN
4.0000 mg | Freq: Three times a day (TID) | INTRAMUSCULAR | Status: DC | PRN
  Administered 2015-11-18: 4 mg via INTRAVENOUS
  Filled 2015-11-18: qty 2

## 2015-11-18 MED ORDER — KETAMINE HCL 10 MG/ML IJ SOLN
INTRAMUSCULAR | Status: DC | PRN
  Administered 2015-11-18: 20 mg via INTRAVENOUS
  Administered 2015-11-18: 10 mg via INTRAVENOUS
  Administered 2015-11-18: 30 mg via INTRAVENOUS
  Administered 2015-11-18: 20 mg via INTRAVENOUS
  Administered 2015-11-18: 30 mg via INTRAVENOUS
  Administered 2015-11-18 (×2): 20 mg via INTRAVENOUS

## 2015-11-18 MED ORDER — NALBUPHINE HCL 10 MG/ML IJ SOLN: 5.0000 mg | Freq: Once | INTRAMUSCULAR | Status: DC | PRN

## 2015-11-18 MED ORDER — PRENATAL MULTIVITAMIN CH
1.0000 | ORAL_TABLET | Freq: Every day | ORAL | Status: DC
  Administered 2015-11-19 – 2015-11-20 (×2): 1 via ORAL
  Filled 2015-11-18 (×2): qty 1

## 2015-11-18 MED ORDER — SODIUM CHLORIDE 0.9 % IR SOLN
Status: DC | PRN
  Administered 2015-11-18: 1000 mL

## 2015-11-18 MED ORDER — LACTATED RINGERS IV SOLN
INTRAVENOUS | Status: DC | PRN
  Administered 2015-11-18: 11:00:00 via INTRAVENOUS

## 2015-11-18 MED ORDER — SIMETHICONE 80 MG PO CHEW: 80.0000 mg | CHEWABLE_TABLET | ORAL | Status: DC | PRN

## 2015-11-18 MED ORDER — ONDANSETRON HCL 4 MG/2ML IJ SOLN
INTRAMUSCULAR | Status: AC
  Filled 2015-11-18: qty 2

## 2015-11-18 MED ORDER — OXYCODONE-ACETAMINOPHEN 5-325 MG PO TABS
2.0000 | ORAL_TABLET | ORAL | Status: DC | PRN
  Administered 2015-11-19 – 2015-11-20 (×3): 2 via ORAL
  Filled 2015-11-18 (×3): qty 2

## 2015-11-18 MED ORDER — MENTHOL 3 MG MT LOZG: 1.0000 | LOZENGE | OROMUCOSAL | Status: DC | PRN

## 2015-11-18 MED ORDER — LACTATED RINGERS IV SOLN
INTRAVENOUS | Status: DC | PRN
Start: 2015-11-18 — End: 2015-11-18
  Administered 2015-11-18 (×3): via INTRAVENOUS

## 2015-11-18 MED ORDER — FENTANYL CITRATE (PF) 100 MCG/2ML IJ SOLN
75.0000 ug | Freq: Once | INTRAMUSCULAR | Status: AC
  Administered 2015-11-18: 75 ug via INTRAVENOUS

## 2015-11-18 MED ORDER — FENTANYL CITRATE (PF) 100 MCG/2ML IJ SOLN
25.0000 ug | INTRAMUSCULAR | Status: DC | PRN
  Administered 2015-11-18: 25 ug via INTRAVENOUS

## 2015-11-18 MED ORDER — SENNOSIDES-DOCUSATE SODIUM 8.6-50 MG PO TABS
2.0000 | ORAL_TABLET | ORAL | Status: DC
  Administered 2015-11-18 – 2015-11-19 (×2): 2 via ORAL
  Filled 2015-11-18 (×2): qty 2

## 2015-11-18 MED ORDER — KETOROLAC TROMETHAMINE 30 MG/ML IJ SOLN: 30.0000 mg | Freq: Four times a day (QID) | INTRAMUSCULAR | Status: AC | PRN

## 2015-11-18 MED ORDER — BUPIVACAINE IN DEXTROSE 0.75-8.25 % IT SOLN
INTRATHECAL | Status: AC
  Filled 2015-11-18: qty 2

## 2015-11-18 MED ORDER — DEXAMETHASONE SODIUM PHOSPHATE 4 MG/ML IJ SOLN
INTRAMUSCULAR | Status: DC | PRN
  Administered 2015-11-18: 4 mg via INTRAVENOUS

## 2015-11-18 MED ORDER — DEXAMETHASONE SODIUM PHOSPHATE 4 MG/ML IJ SOLN
INTRAMUSCULAR | Status: AC
  Filled 2015-11-18: qty 1

## 2015-11-18 MED ORDER — OXYTOCIN 10 UNIT/ML IJ SOLN
INTRAMUSCULAR | Status: AC
  Filled 2015-11-18: qty 4

## 2015-11-18 MED ORDER — OXYTOCIN 10 UNIT/ML IJ SOLN
40.0000 [IU] | INTRAVENOUS | Status: DC | PRN
  Administered 2015-11-18: 40 [IU] via INTRAVENOUS

## 2015-11-18 MED ORDER — BUPIVACAINE IN DEXTROSE 0.75-8.25 % IT SOLN
INTRATHECAL | Status: DC | PRN
  Administered 2015-11-18: 10.5 mg via INTRATHECAL

## 2015-11-18 MED ORDER — NALBUPHINE HCL 10 MG/ML IJ SOLN: 5.0000 mg | INTRAMUSCULAR | Status: DC | PRN

## 2015-11-18 MED ORDER — NALOXONE HCL 2 MG/2ML IJ SOSY
1.0000 ug/kg/h | PREFILLED_SYRINGE | INTRAVENOUS | Status: DC | PRN
  Filled 2015-11-18: qty 2

## 2015-11-18 MED ORDER — LACTATED RINGERS IV SOLN
INTRAVENOUS | Status: DC
  Administered 2015-11-18 (×2): via INTRAVENOUS

## 2015-11-18 MED ORDER — ACETAMINOPHEN 325 MG PO TABS: 650.0000 mg | ORAL_TABLET | ORAL | Status: DC | PRN

## 2015-11-18 MED ORDER — INFLUENZA VAC SPLIT QUAD 0.5 ML IM SUSY
0.5000 mL | PREFILLED_SYRINGE | INTRAMUSCULAR | Status: AC
  Administered 2015-11-19: 0.5 mL via INTRAMUSCULAR
  Filled 2015-11-18: qty 0.5

## 2015-11-18 MED ORDER — IBUPROFEN 600 MG PO TABS: 600.0000 mg | ORAL_TABLET | Freq: Four times a day (QID) | ORAL | Status: DC | PRN

## 2015-11-18 MED ORDER — FENTANYL CITRATE (PF) 100 MCG/2ML IJ SOLN
INTRAMUSCULAR | Status: DC | PRN
  Administered 2015-11-18: 20 ug via INTRATHECAL
  Administered 2015-11-18: 100 ug via INTRAVENOUS
  Administered 2015-11-18: 80 ug via INTRAVENOUS

## 2015-11-18 MED ORDER — SCOPOLAMINE 1 MG/3DAYS TD PT72
1.0000 | MEDICATED_PATCH | Freq: Once | TRANSDERMAL | Status: DC
  Administered 2015-11-18: 1.5 mg via TRANSDERMAL

## 2015-11-18 MED ORDER — DIPHENHYDRAMINE HCL 50 MG/ML IJ SOLN: 12.5000 mg | INTRAMUSCULAR | Status: DC | PRN

## 2015-11-18 MED ORDER — PHENYLEPHRINE 8 MG IN D5W 100 ML (0.08MG/ML) PREMIX OPTIME
INJECTION | INTRAVENOUS | Status: AC
  Filled 2015-11-18: qty 100

## 2015-11-18 MED ORDER — ONDANSETRON HCL 4 MG/2ML IJ SOLN
INTRAMUSCULAR | Status: DC | PRN
  Administered 2015-11-18: 4 mg via INTRAVENOUS

## 2015-11-18 MED ORDER — DIPHENHYDRAMINE HCL 25 MG PO CAPS
25.0000 mg | ORAL_CAPSULE | ORAL | Status: DC | PRN
  Filled 2015-11-18: qty 1

## 2015-11-18 MED ORDER — CEFOTETAN DISODIUM 2 G IJ SOLR
2.0000 g | INTRAMUSCULAR | Status: AC
  Administered 2015-11-18: 2 g via INTRAVENOUS
  Filled 2015-11-18: qty 2

## 2015-11-18 MED ORDER — KETAMINE HCL 10 MG/ML IJ SOLN
INTRAMUSCULAR | Status: AC
Start: 2015-11-18 — End: 2015-11-18
  Filled 2015-11-18: qty 1

## 2015-11-18 MED ORDER — LACTATED RINGERS IV SOLN: INTRAVENOUS | Status: DC

## 2015-11-18 MED ORDER — IBUPROFEN 600 MG PO TABS
600.0000 mg | ORAL_TABLET | Freq: Four times a day (QID) | ORAL | Status: DC
  Administered 2015-11-18 – 2015-11-20 (×7): 600 mg via ORAL
  Filled 2015-11-18 (×7): qty 1

## 2015-11-18 MED ORDER — OXYCODONE-ACETAMINOPHEN 5-325 MG PO TABS
1.0000 | ORAL_TABLET | ORAL | Status: DC | PRN
  Administered 2015-11-19 – 2015-11-20 (×3): 1 via ORAL
  Filled 2015-11-18 (×3): qty 1

## 2015-11-18 MED ORDER — BUPIVACAINE HCL (PF) 0.5 % IJ SOLN
INTRAMUSCULAR | Status: DC | PRN
  Administered 2015-11-18: 30 mL

## 2015-11-18 MED ORDER — MORPHINE SULFATE (PF) 0.5 MG/ML IJ SOLN
INTRAMUSCULAR | Status: DC | PRN
  Administered 2015-11-18: .2 mg via INTRATHECAL
  Administered 2015-11-18: 4.8 mg via EPIDURAL

## 2015-11-18 MED ORDER — MORPHINE SULFATE (PF) 0.5 MG/ML IJ SOLN
INTRAMUSCULAR | Status: AC
  Filled 2015-11-18: qty 10

## 2015-11-18 MED ORDER — MIDAZOLAM HCL 2 MG/2ML IJ SOLN
INTRAMUSCULAR | Status: AC
  Filled 2015-11-18: qty 2

## 2015-11-18 MED ORDER — SODIUM CHLORIDE 0.9 % IJ SOLN: 3.0000 mL | INTRAMUSCULAR | Status: DC | PRN

## 2015-11-18 MED ORDER — KETOROLAC TROMETHAMINE 30 MG/ML IJ SOLN
INTRAMUSCULAR | Status: AC
  Administered 2015-11-18: 30 mg via INTRAMUSCULAR
  Filled 2015-11-18: qty 1

## 2015-11-18 MED ORDER — FERROUS SULFATE 325 (65 FE) MG PO TABS
325.0000 mg | ORAL_TABLET | Freq: Two times a day (BID) | ORAL | Status: DC
  Administered 2015-11-19 – 2015-11-20 (×3): 325 mg via ORAL
  Filled 2015-11-18 (×4): qty 1

## 2015-11-18 MED ORDER — MEPERIDINE HCL 25 MG/ML IJ SOLN: 6.2500 mg | INTRAMUSCULAR | Status: DC | PRN

## 2015-11-18 MED ORDER — SIMETHICONE 80 MG PO CHEW
80.0000 mg | CHEWABLE_TABLET | ORAL | Status: DC
  Administered 2015-11-18 – 2015-11-19 (×2): 80 mg via ORAL
  Filled 2015-11-18 (×2): qty 1

## 2015-11-18 MED ORDER — BUPIVACAINE HCL (PF) 0.5 % IJ SOLN
INTRAMUSCULAR | Status: AC
Start: 2015-11-18 — End: 2015-11-18
  Filled 2015-11-18: qty 30

## 2015-11-18 MED ORDER — MAGNESIUM HYDROXIDE 400 MG/5ML PO SUSP
30.0000 mL | ORAL | Status: DC | PRN
Start: 2015-11-18 — End: 2015-11-20

## 2015-11-18 MED ORDER — DIPHENHYDRAMINE HCL 25 MG PO CAPS: 25.0000 mg | ORAL_CAPSULE | Freq: Four times a day (QID) | ORAL | Status: DC | PRN

## 2015-11-18 MED ORDER — PHENYLEPHRINE HCL 10 MG/ML IJ SOLN
20.0000 mg | INTRAVENOUS | Status: DC | PRN
  Administered 2015-11-18: 60 ug/min via INTRAVENOUS

## 2015-11-18 MED ORDER — WITCH HAZEL-GLYCERIN EX PADS: 1.0000 "application " | MEDICATED_PAD | CUTANEOUS | Status: DC | PRN

## 2015-11-18 MED ORDER — LIDOCAINE-EPINEPHRINE (PF) 2 %-1:200000 IJ SOLN
INTRAMUSCULAR | Status: AC
  Filled 2015-11-18: qty 20

## 2015-11-18 MED ORDER — OXYTOCIN 40 UNITS IN LACTATED RINGERS INFUSION - SIMPLE MED
62.5000 mL/h | INTRAVENOUS | Status: AC
  Filled 2015-11-18: qty 1000

## 2015-11-18 MED ORDER — LACTATED RINGERS IV SOLN
INTRAVENOUS | Status: DC
  Administered 2015-11-18: 09:00:00 via INTRAVENOUS

## 2015-11-18 MED ORDER — SODIUM BICARBONATE 8.4 % IV SOLN
INTRAVENOUS | Status: AC
  Filled 2015-11-18: qty 50

## 2015-11-18 MED ORDER — LANOLIN HYDROUS EX OINT: 1.0000 "application " | TOPICAL_OINTMENT | CUTANEOUS | Status: DC | PRN

## 2015-11-18 SURGICAL SUPPLY — 41 items
BENZOIN TINCTURE PRP APPL 2/3 (GAUZE/BANDAGES/DRESSINGS) ×5 IMPLANT
CLAMP CORD UMBIL (MISCELLANEOUS) IMPLANT
CLIP FILSHIE TUBAL LIGA STRL (Clip) ×5 IMPLANT
CLOSURE WOUND 1/2 X4 (GAUZE/BANDAGES/DRESSINGS) ×1
CLOTH BEACON ORANGE TIMEOUT ST (SAFETY) ×5 IMPLANT
DECANTER SPIKE VIAL GLASS SM (MISCELLANEOUS) ×5 IMPLANT
DRAPE SHEET LG 3/4 BI-LAMINATE (DRAPES) IMPLANT
DRSG OPSITE POSTOP 4X10 (GAUZE/BANDAGES/DRESSINGS) ×5 IMPLANT
DURAPREP 26ML APPLICATOR (WOUND CARE) ×5 IMPLANT
ELECT REM PT RETURN 9FT ADLT (ELECTROSURGICAL) ×5
ELECTRODE REM PT RTRN 9FT ADLT (ELECTROSURGICAL) ×3 IMPLANT
EXTRACTOR VACUUM M CUP 4 TUBE (SUCTIONS) IMPLANT
EXTRACTOR VACUUM M CUP 4' TUBE (SUCTIONS)
GLOVE BIO SURGEON STRL SZ 6.5 (GLOVE) ×8 IMPLANT
GLOVE BIO SURGEONS STRL SZ 6.5 (GLOVE) ×2
GLOVE BIOGEL PI IND STRL 7.0 (GLOVE) ×9 IMPLANT
GLOVE BIOGEL PI INDICATOR 7.0 (GLOVE) ×6
GLOVE ECLIPSE 7.0 STRL STRAW (GLOVE) ×5 IMPLANT
GOWN STRL REUS W/TWL LRG LVL3 (GOWN DISPOSABLE) ×10 IMPLANT
HEMOSTAT SURGICEL 2X14 (HEMOSTASIS) ×5 IMPLANT
KIT ABG SYR 3ML LUER SLIP (SYRINGE) IMPLANT
NEEDLE HYPO 22GX1.5 SAFETY (NEEDLE) ×5 IMPLANT
NEEDLE HYPO 25X5/8 SAFETYGLIDE (NEEDLE) ×5 IMPLANT
NS IRRIG 1000ML POUR BTL (IV SOLUTION) ×5 IMPLANT
PACK C SECTION WH (CUSTOM PROCEDURE TRAY) ×5 IMPLANT
PAD ABD 7.5X8 STRL (GAUZE/BANDAGES/DRESSINGS) ×5 IMPLANT
PAD OB MATERNITY 4.3X12.25 (PERSONAL CARE ITEMS) ×5 IMPLANT
PENCIL SMOKE EVAC W/HOLSTER (ELECTROSURGICAL) ×5 IMPLANT
RTRCTR C-SECT PINK 25CM LRG (MISCELLANEOUS) IMPLANT
SPONGE LAP 18X18 X RAY DECT (DISPOSABLE) ×15 IMPLANT
STAPLER VISISTAT 35W (STAPLE) ×5 IMPLANT
STRIP CLOSURE SKIN 1/2X4 (GAUZE/BANDAGES/DRESSINGS) ×4 IMPLANT
SUT PDS AB 0 CT1 27 (SUTURE) IMPLANT
SUT PDS AB 0 CTX 36 PDP370T (SUTURE) ×10 IMPLANT
SUT PLAIN 2 0 XLH (SUTURE) IMPLANT
SUT VIC AB 0 CTX 36 (SUTURE) ×10
SUT VIC AB 0 CTX36XBRD ANBCTRL (SUTURE) ×15 IMPLANT
SUT VIC AB 4-0 KS 27 (SUTURE) ×5 IMPLANT
SYR CONTROL 10ML LL (SYRINGE) ×5 IMPLANT
TOWEL OR 17X24 6PK STRL BLUE (TOWEL DISPOSABLE) ×5 IMPLANT
TRAY FOLEY CATH SILVER 14FR (SET/KITS/TRAYS/PACK) ×5 IMPLANT

## 2015-11-18 NOTE — Anesthesia Procedure Notes (Signed)
Spinal Patient location during procedure: OR Start time: 11/18/2015 10:17 AM Staffing Anesthesiologist: Mal AmabileFOSTER, Cyris Maalouf Performed by: anesthesiologist  Preanesthetic Checklist Completed: patient identified, site marked, surgical consent, pre-op evaluation, timeout performed, IV checked, risks and benefits discussed and monitors and equipment checked Spinal Block Patient position: sitting Prep: DuraPrep Patient monitoring: cardiac monitor, continuous pulse ox, blood pressure and heart rate Approach: midline Location: L3-4 Injection technique: catheter Needle Needle type: Tuohy and Spinocan  Needle gauge: 24 G Needle length: 12.7 cm Needle insertion depth: 6 cm Catheter type: closed end flexible Catheter size: 19 g Catheter at skin depth: 10 cm Assessment Sensory level: T4 Additional Notes Epidural performed L4-L5 17 ga Touhy LOR with saline. Attempt x 2. SAB performed through epidural needle. CSF clear, free flow, no heme or paresthesias. LA + Narcotics injected. Spinal needle withdrawn and Epidural catheter threaded 4cm into the epidural space. Epidural needle withdrawn and sterile dressing applied.  Patient placed supine with LUD. Patient tolerated procedure well.

## 2015-11-18 NOTE — Anesthesia Postprocedure Evaluation (Signed)
Anesthesia Post Note  Patient: Jenna Greene  Procedure(s) Performed: Procedure(s) (LRB): CESAREAN SECTION (N/A)  Patient location during evaluation: PACU Anesthesia Type: Spinal Level of consciousness: oriented and awake and alert Pain management: pain level controlled Vital Signs Assessment: post-procedure vital signs reviewed and stable Respiratory status: spontaneous breathing and respiratory function stable Cardiovascular status: blood pressure returned to baseline and stable Postop Assessment: no headache, no backache, spinal receding, patient able to bend at knees and no signs of nausea or vomiting Anesthetic complications: no    Last Vitals:  Filed Vitals:   11/18/15 1345 11/18/15 1346  BP:    Pulse: 90 86  Temp:    Resp: 23 23    Last Pain: There were no vitals filed for this visit.               Sirenity Shew A.

## 2015-11-18 NOTE — Transfer of Care (Signed)
Immediate Anesthesia Transfer of Care Note  Patient: Jenna Greene  Procedure(s) Performed: Procedure(s): CESAREAN SECTION (N/A)  Patient Location: PACU  Anesthesia Type:Spinal  Level of Consciousness: awake  Airway & Oxygen Therapy: Patient Spontanous Breathing  Post-op Assessment: Report given to RN  Post vital signs: Reviewed and stable  Last Vitals:  Filed Vitals:   11/18/15 0855  BP: 142/94  Pulse: 96  Temp: 36.8 C  Resp: 20    Complications: No apparent anesthesia complications

## 2015-11-18 NOTE — Lactation Note (Signed)
This note was copied from the chart of Jenna Genavive Adee. Lactation Consultation Note  Patient Name: Jenna Greene [id number]'UToday's Date: 11/18/2015 Reason for consult: Initial assessment RN requested that LC come back later.   Maternal Data    Feeding Feeding Type: Formula  LATCH Score/Interventions                      Lactation Tools Discussed/Used     Consult Status Consult Status: Follow-up Date: 11/18/15    Jenna Greene 11/18/2015, 5:06 PM

## 2015-11-18 NOTE — Anesthesia Postprocedure Evaluation (Signed)
Anesthesia Post Note  Patient: Jenna Greene  Procedure(s) Performed: Procedure(s) (LRB): CESAREAN SECTION (N/A) BILATERAL TUBAL LIGATION (Bilateral)  Patient location during evaluation: Mother Baby Anesthesia Type: Spinal Level of consciousness: awake, awake and alert and oriented Pain management: satisfactory to patient Vital Signs Assessment: post-procedure vital signs reviewed and stable Respiratory status: spontaneous breathing Cardiovascular status: blood pressure returned to baseline and stable Postop Assessment: no headache, no backache, spinal receding, patient able to bend at knees, no signs of nausea or vomiting and adequate PO intake Anesthetic complications: no    Last Vitals:  Filed Vitals:   11/18/15 1613 11/18/15 1718  BP: 138/72 138/72  Pulse: 80 83  Temp: 36.8 C 36.9 C  Resp: 20 22    Last Pain:  Filed Vitals:   11/18/15 1725  PainSc: 1                  Jenna Greene

## 2015-11-18 NOTE — H&P (Signed)
Obstetric Preoperative History and Physical  Jenna Greene is a 44 y.o. [id number] with IUP at [redacted]w[redacted]d presenting for presenting for scheduled cesarean section.  No acute concerns.   Pregnancy complicated by: 1) AMA 2) Non english speaking, Twi 3) Varicella non-immune 4) Late PNC 5) FOB with Hep B  Prenatal Course Source of Care: [facility] with onset of care at 21 weeks Pregnancy complications or risks: Patient Active Problem List   Diagnosis Date Noted  . Advanced maternal age in multigravida 07/17/2015  . Abnormal maternal serum screening test 07/17/2015   She plans to breastfeed She desires bilateral tubal ligation for postpartum contraception.   Prenatal labs and studies: ABO, Rh: --/--/O POS, O POS (12/12 1435) Antibody: NEG (12/12 1435) Rubella: Immune (07/21 0000) RPR: Non Reactive (12/12 1435)  HBsAg: Negative (07/21 0000)  HIV: Non-reactive (07/21 0000)  GBS: NEG 1 hr Glucola  93 Genetic screening abnormal with DSR 1:148 on Quad  Anatomy US normal  Prenatal Transfer Tool  Maternal Diabetes: No Genetic Screening: Abnormal:  Results: Other:1:148 DSR (reduced from 1:93 by age) Maternal Ultrasounds/Referrals: Normal Fetal Ultrasounds or other Referrals:  None Maternal Substance Abuse:  No Significant Maternal Medications:  None Significant Maternal Lab Results: Lab values include: Group B Strep negative, Other: varicella non-immune, FOB with Hepatitis B-mother has negative testing this pregnancy  Past Medical History  Diagnosis Date  . Medical history non-contributory     Past Surgical History  Procedure Laterality Date  . Cesarean section      OB History  Gravida Para Term Preterm AB SAB TAB Ectopic Multiple Living  0 0 0 0 0 0 3    # Outcome Date GA Lbr Len/2nd Weight Sex Delivery Anes PTL Lv  4 Current           3 Term           2 Term           1 Term               Social History   Social History  . Marital Status: Married    Spouse Name: N/A   . Number of Children: N/A  . Years of Education: N/A   Social History Main Topics  . Smoking status: Never Smoker   . Smokeless tobacco: None  . Alcohol Use: No  . Drug Use: No  . Sexual Activity: Yes    Birth Control/ Protection: None   Other Topics Concern  . None   Social History Narrative    History reviewed. No pertinent family history.  Prescriptions prior to admission  Medication Sig Dispense Refill Last Dose  . guaiFENesin (ROBITUSSIN) 100 MG/5ML liquid Take 200 mg by mouth 4 (four) times daily as needed for cough.   11/12/2015 at Unknown time  . Prenatal Vit-Fe Fumarate-FA (PRENATAL MULTIVITAMIN) TABS tablet Take 1 tablet by mouth daily at 12 noon.   11/12/2015 at Unknown time  . pseudoephedrine (SUDAFED) 30 MG tablet Take 30-60 mg by mouth every 6 (six) hours as needed for congestion.   11/12/2015 at Unknown time    Allergies  Allergen Reactions  . Pork-Derived Products Other (See Comments)    Review of Systems: Negative except for what is mentioned in HPI.  Physical Exam: BP 142/94 mmHg  Pulse 96  Temp(Src) 98.2 F (36.8 C) (Oral)  Resp 20  SpO2 100%  LMP 03/11/2015 FHR by Doppler: 133 bpm CONSTITUTIONAL: Well-developed, well-nourished female in no acute distress.  HENT:  Normocephalic, atraumatic, External right and left ear normal. Oropharynx is clear and moist EYES: Conjunctivae and EOM are normal. Pupils are equal, round, and reactive to light. No scleral icterus.  NECK: Normal range of motion, supple, no masses SKIN: Skin is warm and dry. No rash noted. Not diaphoretic. No erythema. No pallor. NEUROLGIC: Alert and oriented to person, place, and time. Normal reflexes, muscle tone coordination. No cranial nerve deficit noted. PSYCHIATRIC: Normal mood and affect. Normal behavior. Normal judgment and thought content. CARDIOVASCULAR: Normal heart rate noted, regular rhythm RESPIRATORY: Effort and breath sounds normal, no problems with respiration  noted ABDOMEN: Soft, nontender, nondistended, gravid. Well-healed Pfannenstiel incision. PELVIC: Deferred MUSCULOSKELETAL: Normal range of motion. No edema and no tenderness. 2+ distal pulses.   Pertinent Labs/Studies:   Results for orders placed or performed during the hospital encounter of 11/16/15 (from the past 72 hour(s))  CBC     Status: Abnormal   Collection Time: 11/16/15  2:35 PM  Result Value Ref Range   WBC 5.9 4.0 - 10.5 K/uL   RBC 3.68 (L) 3.87 - 5.11 MIL/uL   Hemoglobin 11.8 (L) 12.0 - 15.0 g/dL   HCT 16.134.5 (L) 09.636.0 - 04.546.0 %   MCV 93.8 78.0 - 100.0 fL   MCH 32.1 26.0 - 34.0 pg   MCHC 34.2 30.0 - 36.0 g/dL   RDW 40.913.8 81.111.5 - 91.415.5 %   Platelets 221 150 - 400 K/uL  RPR     Status: None   Collection Time: 11/16/15  2:35 PM  Result Value Ref Range   RPR Ser Ql Non Reactive Non Reactive    Comment: (NOTE) Performed At: [facility] [address] [place], [place] [id number] Mila HomerHancock William F MD YQ:[phone]   Type and screen     Status: None   Collection Time: 11/16/15  2:35 PM  Result Value Ref Range   ABO/RH(D) O POS    Antibody Screen NEG    Sample Expiration 11/19/2015   ABO/Rh     Status: None   Collection Time: 11/16/15  2:35 PM  Result Value Ref Range   ABO/RH(D) O POS     Assessment and Plan :Jenna Greene is a 44 y.o. G4P3003 at 3254w0d being admitted being admitted for scheduled cesarean section, repeat x4 with BTL. The risks of cesarean section discussed with the patient included but were not limited to: bleeding which may require transfusion or reoperation; infection which may require antibiotics; injury to bowel, bladder, ureters or other surrounding organs; injury to the fetus; need for additional procedures including hysterectomy in the event of a life-threatening hemorrhage; placental abnormalities wth subsequent pregnancies, incisional problems, thromboembolic phenomenon and other postoperative/anesthesia complications. The patient concurred with  the proposed plan, giving informed written consent for the procedure. Patient has been NPO since last night she will remain NPO for procedure. Anesthesia and OR aware. Preoperative prophylactic antibiotics and SCDs ordered on call to the OR. To OR when ready.    Jenna FlakeKimberly Niles Warrick Llera, MD  OB Fellow  Faculty Practice, [facility] [place]

## 2015-11-18 NOTE — Lactation Note (Signed)
This note was copied from the chart of Jenna Jurnee Schey. Lactation Consultation Note  Patient Name: Jenna Sanjuan Dameraba Retter HQION'GToday's Date: 11/18/2015 Reason for consult: Follow-up assessment Baby at 10 hr of life and mom is requesting that baby be brought back from the nursery so that she can bf. Mom was at the hospital alone during day shift. She was throwing up and very sleepy so baby was taken to the nursery. FOB is back for the night and will help mom with care. Mom reports that she is feeling much better but still looks sleepy. Discussed feeding frequency, baby belly size, breast changes, and nipple care. Given lactation handouts. She is aware of OP services and support group.   Maternal Data    Feeding Feeding Type: Formula  LATCH Score/Interventions                      Lactation Tools Discussed/Used     Consult Status Consult Status: Follow-up Date: 11/19/15 Follow-up type: In-patient    Jenna Greene 11/18/2015, 9:35 PM

## 2015-11-18 NOTE — Op Note (Signed)
Jenna Greene PROCEDURE DATE: 11/18/2015  PREOPERATIVE DIAGNOSES: Intrauterine pregnancy at 8051w0d weeks gestation; three previous cesarean sections; undesired fertility  POSTOPERATIVE DIAGNOSES: The same; dense adhesive disease  PROCEDURE: Repeat Low Transverse Cesarean Section, Bilateral Tubal Sterilization using Filshie clips  SURGEON:  Dr. Jaynie CollinsUgonna Celie Desrochers  ASSISTANT:  Dr. Lorain ChildesKimberley Newton  ANESTHESIOLOGIST: Dr. Mal AmabileMichael Foster  INDICATIONS: Jenna Greene is a 44 y.o. [phone] at 5351w0d here for repeat cesarean section and bilateral tubal sterilization secondary to the indications listed under preoperative diagnoses; please see preoperative note for further details.  The risks of surgery were discussed with the patient including but were not limited to: bleeding which may require transfusion or reoperation; infection which may require antibiotics; injury to bowel, bladder, ureters or other surrounding organs; injury to the fetus; need for additional procedures including hysterectomy in the event of a life-threatening hemorrhage; placental abnormalities wth subsequent pregnancies, incisional problems, thromboembolic phenomenon and other postoperative/anesthesia complications.  Patient also desires permanent sterilization.  Other reversible forms of contraception were discussed with patient; she declines all other modalities. Risks of procedure discussed with patient including but not limited to: risk of regret, permanence of method, bleeding, infection, injury to surrounding organs and need for additional procedures.  Failure risk of 1-2% with increased risk of ectopic gestation if pregnancy occurs was also discussed with patient.  The patient concurred with the proposed plan, giving informed written consent for the procedures.    FINDINGS:  Viable female infant in cephalic presentation.  Apgars 9 and 9.  Weight 8 lb 10.3 oz. Clear amniotic fluid.  Intact placenta, three vessel cord.  Uterus was noted to be  densely adherent to the anterior abdominal wall especially in the middle portion of the fundus and lower uterine segment.  Could not recognize distinct lower uterine segment, there was just rectus muscles on top of the uterus. The rectus muscles had to be transected to allow access to the membranes and fetus for delivery.  There was diffuse oozing from adhesion surfaces at the end of the case, Surgicel was placed.  Dilated distal portions of the  fallopian tubes noted bilaterally, normal appearing proximal portion of fallopian tubes and ovaries.  Fallopian tubes sterilized with Filshie clips bilaterally.  ANESTHESIA: Spinal INTRAVENOUS FLUIDS: 2900 ml ESTIMATED BLOOD LOSS: 1000 ml URINE OUTPUT:  250 ml SPECIMENS: Placenta sent to L&D COMPLICATIONS: None immediate  PROCEDURE IN DETAIL:  The patient preoperatively received intravenous antibiotics and had sequential compression devices applied to her lower extremities.   She was then taken to the operating room where spinal anesthesia was administered and was found to be adequate. She was then placed in a dorsal supine position with a leftward tilt, and prepped and draped in a sterile manner.  A foley catheter was placed into her bladder and attached to constant gravity.  After an adequate timeout was performed, a Pfannenstiel skin incision was made with scalpel over her preexisting scar and carried through to the underlying layer of fascia. The fascia was incised in the midline, and this incision was extended bilaterally using the Mayo scissors.  Kocher clamps were applied to the superior aspect of the fascial incision and the underlying rectus muscles were dissected off bluntly. A similar process was carried out on the inferior aspect of the fascial incision. An attempt was made to separate the rectus muscles but they were noted to be adherent to the uterus.  Using hemostats, a small entry was made and it was quickly recognized that we were in the  intrauterine space as amniotic membranes became visible.  There was no recognizable lower uterine segment, just rectus muscles on the outside of the membranes.  The muscles were transected with care given to avoid epigastric vessels, and enough space was created to deliver the infant.  The infant was successfully delivered, the cord was clamped and cut after one minute, and the infant was handed over to awaiting neonatology team.  Bimanual massage was then administered, and the placenta delivered intact with a three-vessel cord. The uterus was then cleared of clot and debris.  The hysterotomy, which comprised of rectus muscles and some recognizable uterine wall on the superior part of the incision, was closed with 0 Vicryl in a running locked fashion.  There was significant bleeding and adhesions noted above the superior portion of the hysterotomy were lysed in blunt and sharp methods.  There were some omental adhesions that were clamped, cut and suture ligated with 0 Vicryl.  Once the uterine adhesions were freed from the anterior abdominal wall, and an imbricating layer was also placed with 0 Vicryl to help with hemostasis.  Several figure-of-eight 0 Vicryl serosal and muscle stitches were also placed to help with hemostasis.  Attention was then turned to the fallopian tubes, and Filshie clips were placed about 3 cm from the cornua, with care given to incorporate the underlying mesosalpinx on both sides, allowing for bilateral tubal sterilization. Surgicel was then placed over the entire anterior surface of the uterus and adherent rectus muscles. The entire repair of the hysterotomy and obtaining fair hemostasis took almost one hour.  The fascia was then closed using 0 PDS in a running fashion.  The subcutaneous layer was irrigated, and 30 ml of 0.5% Marcaine was injected subcutaneously around the incision.  The skin was closed with staples. The patient tolerated the procedure well. Sponge, lap, instrument and  needle counts were correct x 3.  She was taken to the recovery room in stable condition.   Of note, this procedure would not have been easier with a vertical skin incision as the most dense adhesions were in the midline portion of the fundus.  However, any future uterine surgeries may benefit from a vertical skin incision to help with optimizing exposure.      Jaynie Collins, MD, FACOG Attending Obstetrician & Gynecologist Faculty Practice, [facility]

## 2015-11-18 NOTE — Progress Notes (Signed)
Patient offered water to drink. She drank 100 cc water and very soon thereafter vomited it back up. Plan to hold PO fluids temporarily, but will medicate with antiemetic if nausea/vomiting persists.

## 2015-11-19 ENCOUNTER — Encounter (HOSPITAL_COMMUNITY): Payer: Self-pay | Admitting: Obstetrics & Gynecology

## 2015-11-19 LAB — CBC
HCT: 26.2 % — ABNORMAL LOW (ref 36.0–46.0)
HEMOGLOBIN: 9 g/dL — AB (ref 12.0–15.0)
MCH: 32 pg (ref 26.0–34.0)
MCHC: 34.4 g/dL (ref 30.0–36.0)
MCV: 93.2 fL (ref 78.0–100.0)
PLATELETS: 176 10*3/uL (ref 150–400)
RBC: 2.81 MIL/uL — AB (ref 3.87–5.11)
RDW: 13.9 % (ref 11.5–15.5)
WBC: 9 10*3/uL (ref 4.0–10.5)

## 2015-11-19 NOTE — Lactation Note (Signed)
This note was copied from the chart of Jenna Gusta Haynesworth. Lactation Consultation Note  Patient Name: Jenna Greene ZOXWR'UToday's Date: 11/19/2015 Reason for consult: Follow-up assessment Baby at 30 hr of life and sleeping well. Mom was eating. Mom voiced concerns about not making enough milk. Demonstrated manual expression, colostrum noted. Mom denied nipple and breast pain. Discussed feeding frequency, baby belly size, voids, wt loss, breast changes, and nipple care. Mom will call at next feeding for LC to view latch. She is aware of OP services and support group.    Maternal Data    Feeding Length of feed: 5 min  LATCH Score/Interventions                      Lactation Tools Discussed/Used     Consult Status Consult Status: Follow-up Date: 11/20/15 Follow-up type: In-patient    [name] 11/19/2015, 5:27 PM

## 2015-11-19 NOTE — Progress Notes (Signed)
Post Operative Day 1 Subjective:  Sanjuan Dameraba Rando is a 44 y.o. [id number] 5156w0d s/p repeat LTCS.  No acute events overnight.  Pt denies problems with ambulating, voiding or po intake.  She reports nausea or vomiting yesterday, but will try some breakfast this morning.  Pain is well controlled.  She has had flatus.  Lochia Minimal.  Plan for birth control is bilateral tubal ligation.  Method of Feeding: breast.   Objective: Blood pressure 139/69, pulse 77, temperature 97.9 F (36.6 C), temperature source Oral, resp. rate 18, last menstrual period 03/11/2015, SpO2 98 %, unknown if currently breastfeeding.  Physical Exam:  General: alert, cooperative and no distress Lochia:normal flow Chest: normal WOB Heart: Regular rate Abdomen: +BS, soft, mild TTP (appropriate) Uterine Fundus: firm, below umbillicus DVT Evaluation: No evidence of DVT seen on physical exam. Extremities: No edema   Recent Labs  11/18/15 1710 11/19/15 0607  HGB 10.4* 9.0*  HCT 30.5* 26.2*    Assessment/Plan:  ASSESSMENT: Sanjuan Dameraba Cala is a 44 y.o. [id number] 8556w0d s/p repeat LTCS and BTL.   Plan for discharge tomorrow Continue routine PP care Breastfeeding support PRN  LOS: 1 day   Olivia Cantermanda Schultz 11/19/2015, 8:52 AM   CNM attestation Post Partum Day #1 I have seen and examined this patient and agree with above documentation in the resident's note.   Sanjuan Dameraba Coultas is a 44 y.o. [id number] s/p rLTCS.  Pt denies problems with ambulating, voiding or po intake. Pain is well controlled.  Plan for birth control is bilateral tubal ligation.  Method of Feeding: breastd  PE:  BP 136/69 mmHg  Pulse 82  Temp(Src) 98.2 F (36.8 C) (Oral)  Resp 18  SpO2 98%  LMP 03/11/2015  Breastfeeding? Unknown Fundus firm  Plan for discharge: 11/20/15.  Cam HaiSHAW, KIMBERLY, CNM 12:31 AM

## 2015-11-20 LAB — TYPE AND SCREEN
ABO/RH(D): O POS
ANTIBODY SCREEN: NEGATIVE
UNIT DIVISION: 0
Unit division: 0

## 2015-11-20 MED ORDER — IBUPROFEN 600 MG PO TABS: 600.0000 mg | ORAL_TABLET | Freq: Four times a day (QID) | ORAL | Status: DC

## 2015-11-20 MED ORDER — OXYCODONE-ACETAMINOPHEN 5-325 MG PO TABS: 1.0000 | ORAL_TABLET | ORAL | Status: DC | PRN

## 2015-11-20 NOTE — Lactation Note (Signed)
This note was copied from the chart of Jenna Greene. Lactation Consultation Note  Patient Name: Jenna Greene JOACZ'YToday's Date: 11/20/2015 Reason for consult: Follow-up assessment   Follow up with exp BF mom of 50 hour old infant. Dr. Margo AyeHall requested that infant be evaluated for D/C today. Infant BF x 9 for 10-30 minutes, 5 bottle feeds of formula of 10-15 cc, 1 void and 2 stools in last 24 hours. Infant weight 8 lb 1.8 oz with 6% weight loss since birth. LATCH scores 7-8 by Bedside RN. Mom reports she is accustomed to giving formula until her milk comes in. Mom reports her breast are fuller today and she can express colostrum easier. Mom reports she has nipple pain with latch that improves with feeding. Mom denies nipple bruising or excoriation. Discussed formula supplementation guidelines and gave mom supplementation chart. Infant with f/u ped appointment on Monday at 1430. Mom feels comfortable with how BF is going. Engorgement prevention discussed, hand pump given with instructions for use and cleaning. Discussed milk coming to volume. Reviewed all BF information in Taking Care of Baby and Me Booklet. Asked mom to maintain feeding logs and take to Ped visit. Reviewed LC Brochure, mom aware of OP Services, Support Groups and phone #. Discussed findings with Dr. Margo AyeHall and mom's RN Jenna Greene. Enc mom to call over weekend if she has any questions/concerns.  Maternal Data Formula Feeding for Exclusion: No Does the patient have breastfeeding experience prior to this delivery?: Yes  Feeding Feeding Type: Breast Fed Length of feed: 10 min  LATCH Score/Interventions                      Lactation Tools Discussed/Used WIC Program: Yes Pump Review: Setup, frequency, and cleaning;Milk Storage   Consult Status Consult Status: Complete Follow-up type: Call as needed    Ed BlalockSharon S Hice 11/20/2015, 2:40 PM

## 2015-11-20 NOTE — Discharge Instructions (Signed)
Cesarean Delivery, Care After  Refer to this sheet in the next few weeks. These instructions provide you with information on caring for yourself after your procedure. Your health care provider may also give you specific instructions. Your treatment has been planned according to current medical practices, but problems sometimes occur. Call your health care provider if you have any problems or questions after you go home.  HOME CARE INSTRUCTIONS   Only take over-the-counter or prescription medications as directed by your health care provider.   Do not drink alcohol, especially if you are breastfeeding or taking medication to relieve pain.   Do not chew or smoke tobacco.   Continue to use good perineal care. Good perineal care includes:    Wiping your perineum from front to back.    Keeping your perineum clean.   Check your surgical cut (incision) daily for increased redness, drainage, swelling, or separation of skin.   Clean your incision gently with soap and water every day, and then pat it dry. If your health care provider says it is okay, leave the incision uncovered. Use a bandage (dressing) if the incision is draining fluid or appears irritated. If the adhesive strips across the incision do not fall off within 7 days, carefully peel them off.   Hug a pillow when coughing or sneezing until your incision is healed. This helps to relieve pain.   Do not use tampons or douche until your health care provider says it is okay.   Shower, wash your hair, and take tub baths as directed by your health care provider.   Wear a well-fitting bra that provides breast support.   Limit wearing support panties or control-top hose.   Drink enough fluids to keep your urine clear or pale yellow.   Eat high-fiber foods such as whole grain cereals and breads, brown rice, beans, and fresh fruits and vegetables every day. These foods may help prevent or relieve constipation.   Resume activities such as climbing stairs,  driving, lifting, exercising, or traveling as directed by your health care provider.   Talk to your health care provider about resuming sexual activities. This is dependent upon your risk of infection, your rate of healing, and your comfort and desire to resume sexual activity.   Try to have someone help you with your household activities and your newborn for at least a few days after you leave the hospital.   Rest as much as possible. Try to rest or take a nap when your newborn is sleeping.   Increase your activities gradually.   Keep all of your scheduled postpartum appointments. It is very important to keep your scheduled follow-up appointments. At these appointments, your health care provider will be checking to make sure that you are healing physically and emotionally.  SEEK MEDICAL CARE IF:    You are passing large clots from your vagina. Save any clots to show your health care provider.   You have a foul smelling discharge from your vagina.   You have trouble urinating.   You are urinating frequently.   You have pain when you urinate.   You have a change in your bowel movements.   You have increasing redness, pain, or swelling near your incision.   You have pus draining from your incision.   Your incision is separating.   You have painful, hard, or reddened breasts.   You have a severe headache.   You have blurred vision or see spots.   You feel sad   or depressed.   You have thoughts of hurting yourself or your newborn.   You have questions about your care, the care of your newborn, or medications.   You are dizzy or light-headed.   You have a rash.   You have pain, redness, or swelling at the site of the removed intravenous access (IV) tube.   You have nausea or vomiting.   You stopped breastfeeding and have not had a menstrual period within 12 weeks of stopping.   You are not breastfeeding and have not had a menstrual period within 12 weeks of delivery.   You have a fever.  SEEK  IMMEDIATE MEDICAL CARE IF:   You have persistent pain.   You have chest pain.   You have shortness of breath.   You faint.   You have leg pain.   You have stomach pain.   Your vaginal bleeding saturates 2 or more sanitary pads in 1 hour.  MAKE SURE YOU:    Understand these instructions.   Will watch your condition.   Will get help right away if you are not doing well or get worse.     This information is not intended to replace advice given to you by your health care provider. Make sure you discuss any questions you have with your health care provider.     Document Released: 08/13/2002 Document Revised: 12/12/2014 Document Reviewed: 07/18/2012  [employer]vier Interactive Patient Education 2016 [employer]

## 2015-11-20 NOTE — Discharge Summary (Signed)
OB Discharge Summary     Patient Name: Jenna Greene DOB: 1971/11/29 MRN: [id number]  Date of admission: 11/18/2015 Delivering MD: Jaynie Collins A   Date of discharge: 11/20/2015  Admitting diagnosis: cpt [phone] - 4th REPEAT C-S and undesired fertility Intrauterine pregnancy: [redacted]w[redacted]d     Secondary diagnosis:  Active Problems:   S/P cesarean section with bilateral tubal ligation  Additional problems: Same     Discharge diagnosis: Term Pregnancy Delivered                                                                                                Post partum procedures:none  Complications: None  Hospital course:  Sceduled C/S   44 y.o. yo G4P4004 at [redacted]w[redacted]d was admitted to the hospital 11/18/2015 for scheduled cesarean section with the following indication:Elective Repeat.  Membrane Rupture Time/Date: 11:07 AM ,11/18/2015   Patient delivered a Viable infant.11/18/2015  Details of operation can be found in separate operative note.  Pateint had an uncomplicated postpartum course.  She is ambulating, tolerating a regular diet, passing flatus, and urinating well. Patient is discharged home in stable condition on No discharge date for patient encounter.          Physical exam  Filed Vitals:   11/19/15 0520 11/19/15 0930 11/19/15 1728 11/20/15 0515  BP: 139/69 123/67 121/68 136/69  Pulse: 77 96 83 82  Temp: 97.9 F (36.6 C) 98.5 F (36.9 C) 97.7 F (36.5 C) 98.2 F (36.8 C)  TempSrc:   Oral Oral  Resp: SpO2: 98%      General: alert, cooperative and no distress Lochia: appropriate Uterine Fundus: firm Incision: Healing well with no significant drainage, No significant erythema DVT Evaluation: No evidence of DVT seen on physical exam. Negative Homan's sign. Labs: Lab Results  Component Value Date   WBC 9.0 11/19/2015   HGB 9.0* 11/19/2015   HCT 26.2* 11/19/2015   MCV 93.2 11/19/2015   PLT 176 11/19/2015   No flowsheet data found.  Discharge instruction:  per After Visit Summary and "Baby and Me Booklet".  After visit meds:    Medication List    STOP taking these medications        guaiFENesin 100 MG/5ML liquid  Commonly known as:  ROBITUSSIN     pseudoephedrine 30 MG tablet  Commonly known as:  SUDAFED      TAKE these medications        ibuprofen 600 MG tablet  Commonly known as:  ADVIL,MOTRIN  Take 1 tablet (600 mg total) by mouth every 6 (six) hours.     oxyCODONE-acetaminophen 5-325 MG tablet  Commonly known as:  PERCOCET/ROXICET  Take 1 tablet by mouth every 4 (four) hours as needed for moderate pain.     prenatal multivitamin Tabs tablet  Take 1 tablet by mouth daily at 12 noon.        Diet: routine diet  Activity: Advance as tolerated. Pelvic rest for 6 weeks.   Outpatient follow up:6 weeks Follow up Appt:No future appointments. Follow up Visit:No Follow-up on file.  Postpartum contraception: Tubal  Ligation  Newborn Data: Live born female  Birth Weight: 8 lb 10.3 oz (3920 g) APGAR: 9, 9  Baby Feeding: Breast Disposition:home with mother   11/20/2015 Candelaria CelesteSTINSON, [place] Silveria JEHIEL, DO

## 2015-12-11 ENCOUNTER — Ambulatory Visit: Payer: Medicaid Other

## 2015-12-11 DIAGNOSIS — Z5189 Encounter for other specified aftercare: Secondary | ICD-10-CM

## 2015-12-11 NOTE — Progress Notes (Signed)
Pt present today for a wound check and to have her staples removal after c-section on 11/28/2015.  Staples were removed and incision were cleaned.  Patient will follow up in two weeks for check.

## 2015-12-25 ENCOUNTER — Ambulatory Visit: Payer: Medicaid Other

## 2015-12-25 VITALS — BP 178/98 | HR 78 | Temp 98.0°F | Resp 18 | Wt 191.5 lb

## 2015-12-25 DIAGNOSIS — O139 Gestational [pregnancy-induced] hypertension without significant proteinuria, unspecified trimester: Secondary | ICD-10-CM

## 2015-12-25 DIAGNOSIS — Z98891 History of uterine scar from previous surgery: Secondary | ICD-10-CM

## 2015-12-25 MED ORDER — HYDROCHLOROTHIAZIDE 25 MG PO TABS: 25.0000 mg | ORAL_TABLET | Freq: Every day | ORAL | Status: DC

## 2015-12-25 MED ORDER — PRENATAL VITAMIN 27-0.8 MG PO TABS: 1.0000 | ORAL_TABLET | Freq: Every day | ORAL | Status: DC

## 2015-12-25 MED ORDER — IBUPROFEN 800 MG PO TABS: 800.0000 mg | ORAL_TABLET | Freq: Three times a day (TID) | ORAL | Status: DC | PRN

## 2015-12-25 NOTE — Progress Notes (Signed)
Patient ID: Jenna Greene, female   DOB: 02/15/1971, 45 y.o.   MRN: [id number] Language Line Used [id number] Pt came in for a fu wound check. Wound looks great and is healing as it should. Pt was concerned with pain in the LLQ I asked Dr. Marice Potter to assist me in checking the patient before she was to leave. Dr Marice Potter stated the wound looked great and decided to prescribe patient  of Motrin. Also pt expreienced elevated BPs of 170/92 ,176/100, and 178/98. Dr Marice Potter decided to put the pt on HCTZ 25 mg/day for the BP. Pt is to be seen in 1week for BP fu.  Ordered medications and sent them to the pharmacy for patient to pick up.

## 2016-01-01 ENCOUNTER — Other Ambulatory Visit: Payer: Self-pay | Admitting: Obstetrics and Gynecology

## 2016-01-01 ENCOUNTER — Ambulatory Visit: Payer: Medicaid Other

## 2016-01-01 DIAGNOSIS — O139 Gestational [pregnancy-induced] hypertension without significant proteinuria, unspecified trimester: Secondary | ICD-10-CM

## 2016-01-01 LAB — CBC
HEMATOCRIT: 37.5 % (ref 36.0–46.0)
HEMOGLOBIN: 13 g/dL (ref 12.0–15.0)
MCH: 31.4 pg (ref 26.0–34.0)
MCHC: 34.7 g/dL (ref 30.0–36.0)
MCV: 90.6 fL (ref 78.0–100.0)
MPV: 9.4 fL (ref 8.6–12.4)
Platelets: 233 10*3/uL (ref 150–400)
RBC: 4.14 MIL/uL (ref 3.87–5.11)
RDW: 13.9 % (ref 11.5–15.5)
WBC: 3.8 10*3/uL — AB (ref 4.0–10.5)

## 2016-01-01 LAB — COMPREHENSIVE METABOLIC PANEL
ALBUMIN: 4.1 g/dL (ref 3.6–5.1)
ALK PHOS: 82 U/L (ref 33–115)
ALT: 21 U/L (ref 6–29)
AST: 19 U/L (ref 10–30)
BILIRUBIN TOTAL: 0.5 mg/dL (ref 0.2–1.2)
BUN: 19 mg/dL (ref 7–25)
CALCIUM: 9.1 mg/dL (ref 8.6–10.2)
CO2: 27 mmol/L (ref 20–31)
CREATININE: 0.85 mg/dL (ref 0.50–1.10)
Chloride: 101 mmol/L (ref 98–110)
Glucose, Bld: 80 mg/dL (ref 65–99)
Potassium: 3.5 mmol/L (ref 3.5–5.3)
SODIUM: 140 mmol/L (ref 135–146)
TOTAL PROTEIN: 6.9 g/dL (ref 6.1–8.1)

## 2016-01-01 LAB — PROTEIN / CREATININE RATIO, URINE
Creatinine, Urine: 96 mg/dL (ref 20–320)
Protein Creatinine Ratio: 52 mg/g creat (ref 21–161)
TOTAL PROTEIN, URINE: 5 mg/dL (ref 5–24)

## 2016-01-01 MED ORDER — AMLODIPINE BESYLATE 10 MG PO TABS: 10.0000 mg | ORAL_TABLET | Freq: Every day | ORAL | Status: DC

## 2016-01-01 NOTE — Progress Notes (Signed)
Pt here today for BP check after starting HCTZ 25 mg po daily for elevated BP postpartum.  BP's LA 160/100 and RA 162/105 manually.  Pt c/o headaches.  Per Dr. Jolayne Panther, pt will get stat labs drawn today, add Norvasc 10 mg po daily, continue HCTZ as prescribed and come back in one week for BP check.  Pt agreed.

## 2016-01-08 ENCOUNTER — Ambulatory Visit: Payer: Medicaid Other

## 2016-01-08 VITALS — BP 132/82 | HR 73 | Temp 98.1°F | Resp 18 | Wt 194.8 lb

## 2016-01-08 DIAGNOSIS — O139 Gestational [pregnancy-induced] hypertension without significant proteinuria, unspecified trimester: Secondary | ICD-10-CM

## 2016-01-08 NOTE — Progress Notes (Signed)
Patient ID: Jenna Greene, female   DOB: 04/18/1971, 45 y.o.   MRN: [id number] Pt presented today for f/u BP check, Initial BP in the RA was 148/77 after waiting for and taking the BP in the LA the BP was 135/82. Pt reports feeling better this week and denies blurred vision or severe headaches.  Per Dr. Adrian Blackwater pt does not need to continue BP checks weekly.   Language Line Used TWI: [id number]

## 2016-12-21 IMAGING — CR DG CHEST 1V
1 series · 1 of 1 positions shown · non-contrast
Comparison: None in PACs

CLINICAL DATA: Positive PPD, asymptomatic, nonsmoker, 20 weeks
pregnant

EXAM:
CHEST  1 VIEW

[w chest pa]
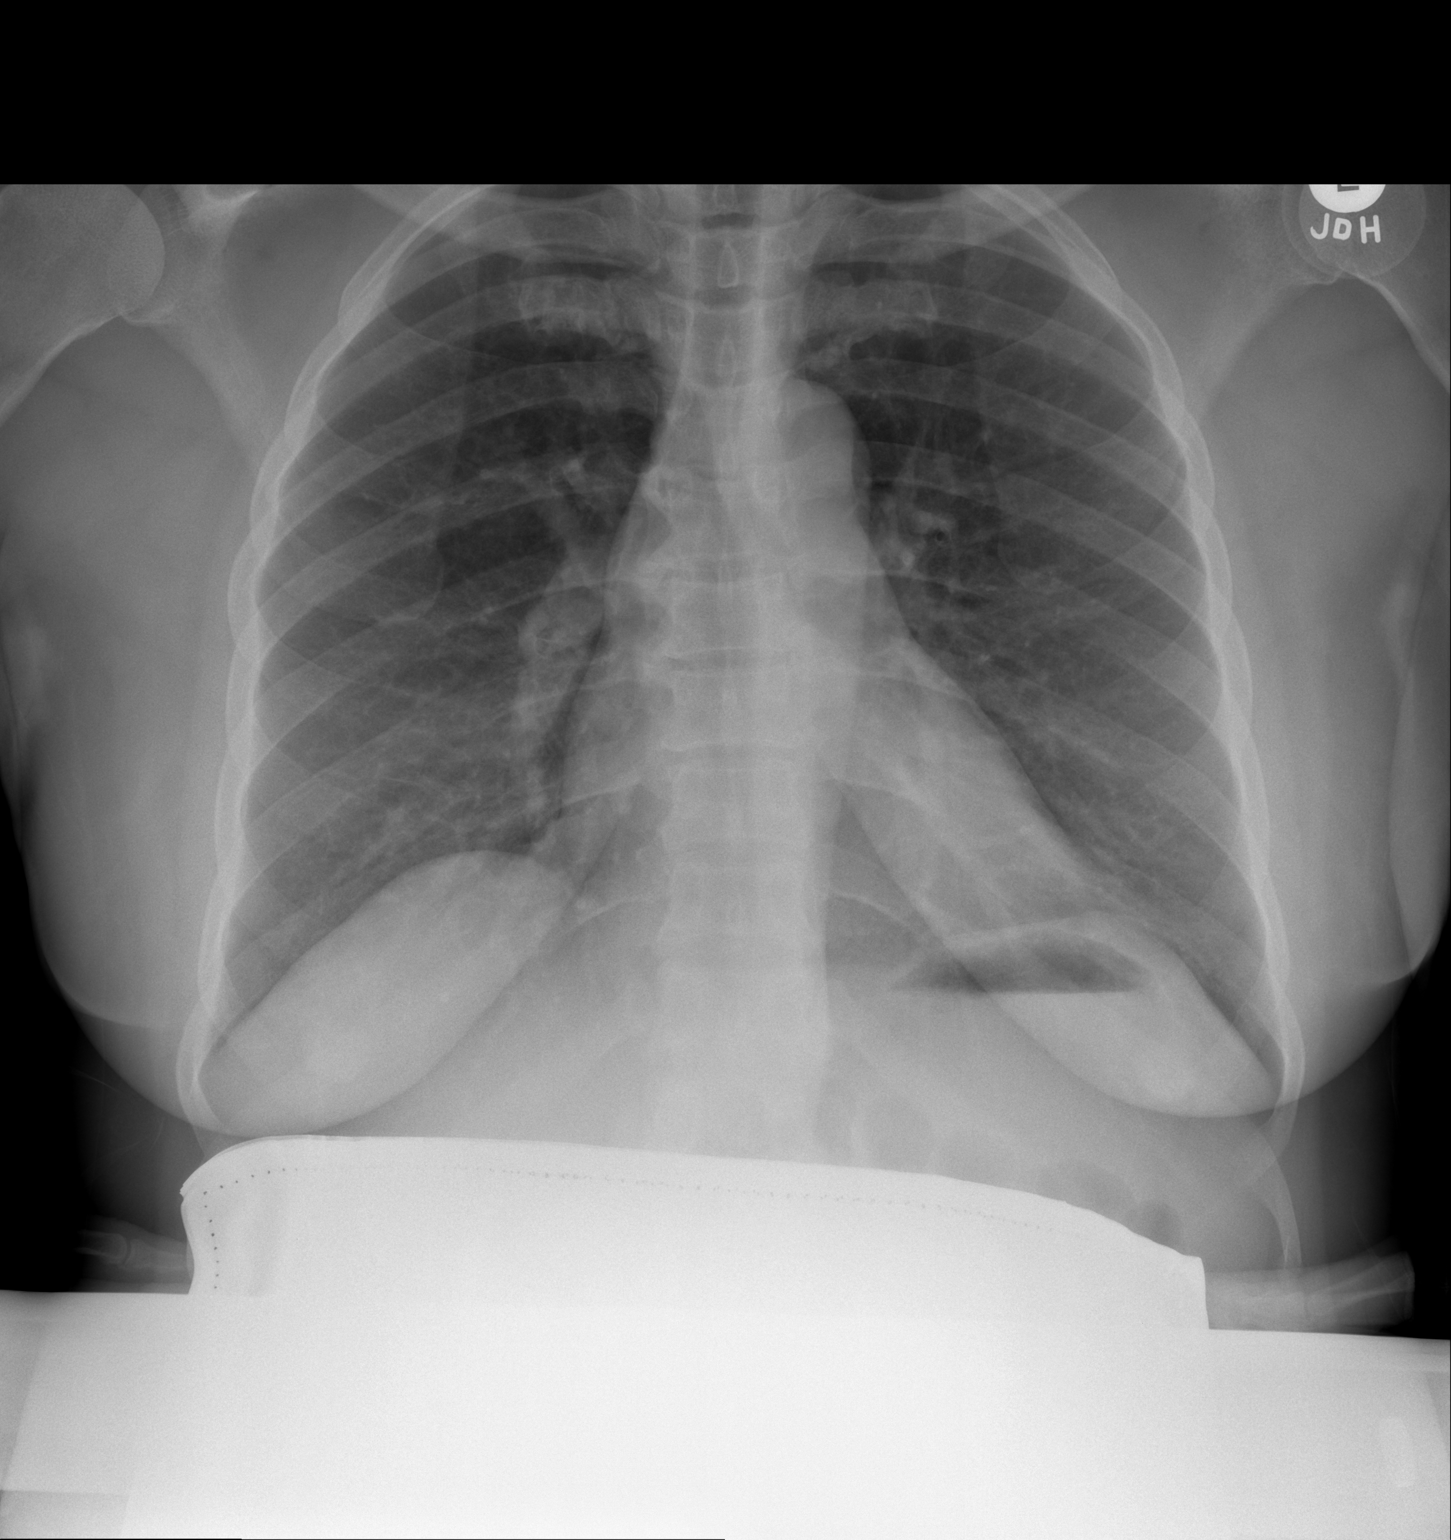

[1 of 1 positions shown; findings below may reference images not displayed]

FINDINGS: The lungs are adequately inflated and clear. The heart and pulmonary
vascularity are normal. The mediastinum is normal in width. There is
no pleural effusion. The bony thorax is unremarkable.
IMPRESSION: There is no evidence of acute or old tuberculous infection nor other
active cardiopulmonary disease.

## 2017-04-04 IMAGING — US US MFM FETAL BPP W/O NON-STRESS
1 series · 15 of 28 positions shown · non-contrast
Comparison: none

[Series 1: us mfm fetal bpp w/o non-stress · 41 acquisitions, 15 frames shown]
[im 1/41]
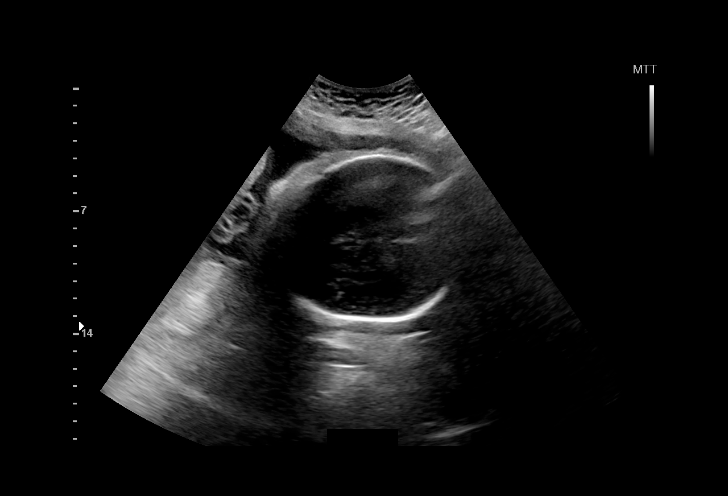
[im 3/41]
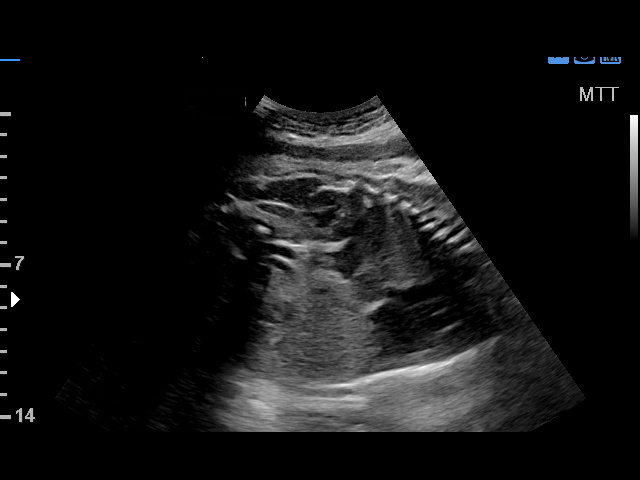
[im 6/41]
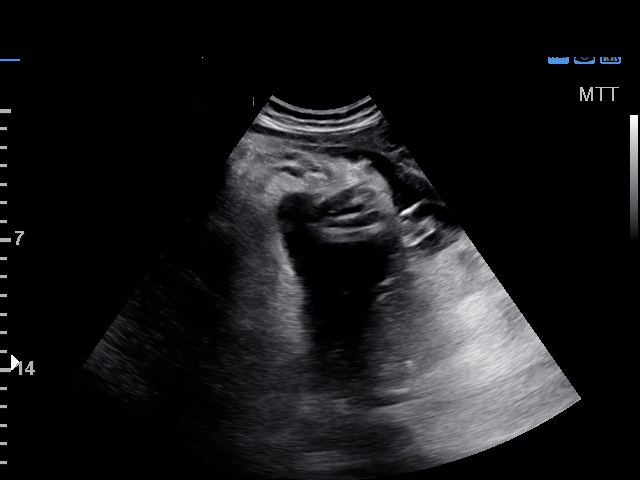
[im 9/41]
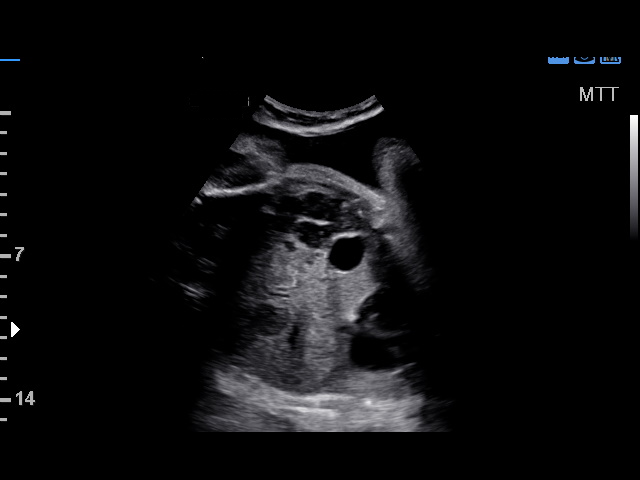
[im 12/41]
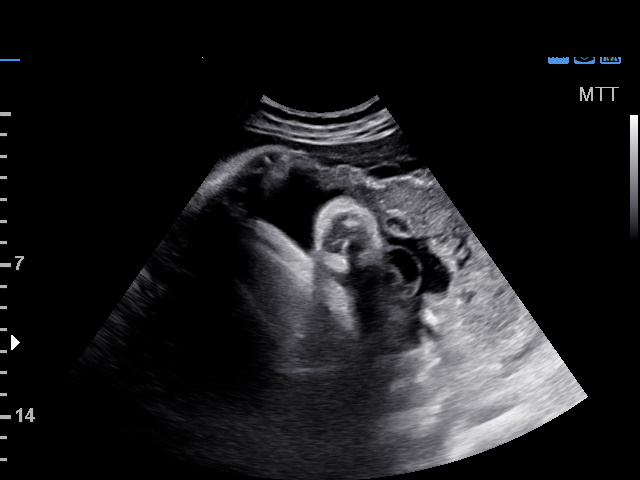
[im 15/41]
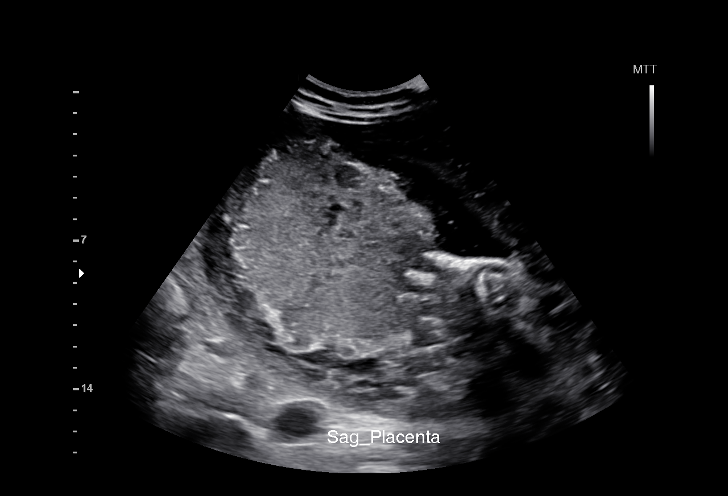
[im 18/41]
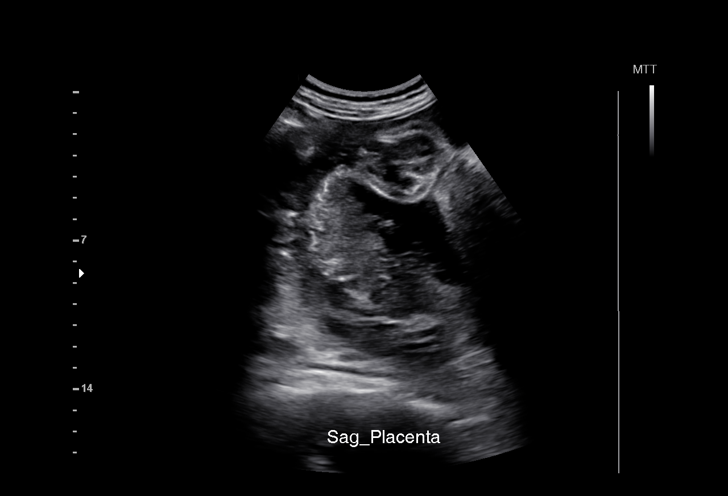
[im 21/41]
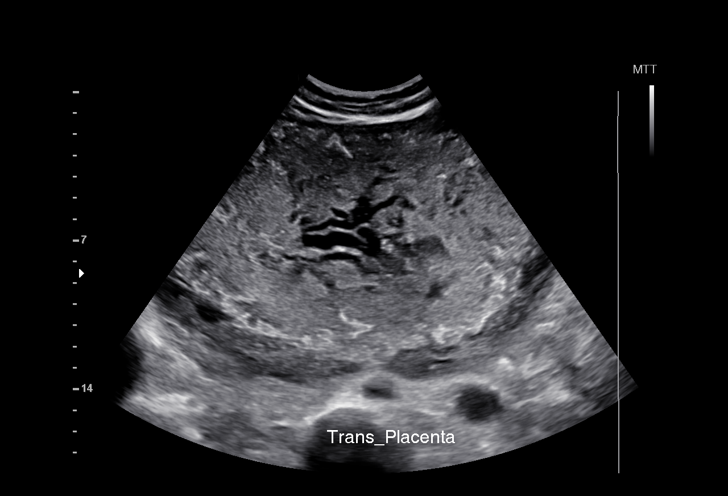
[im 23/41]
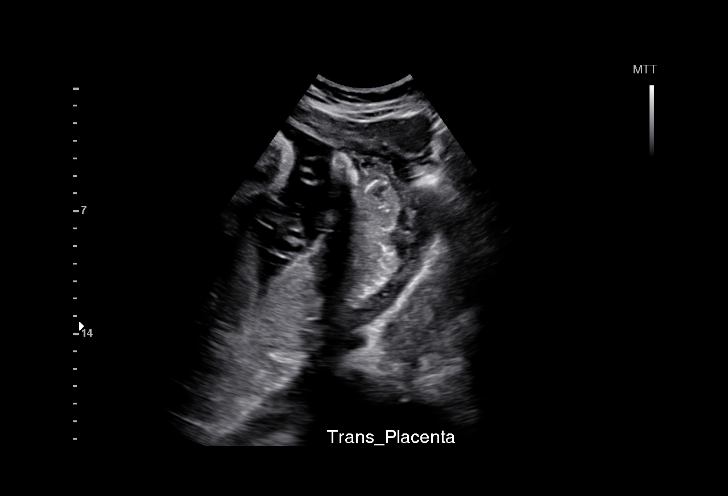
[im 26/41]
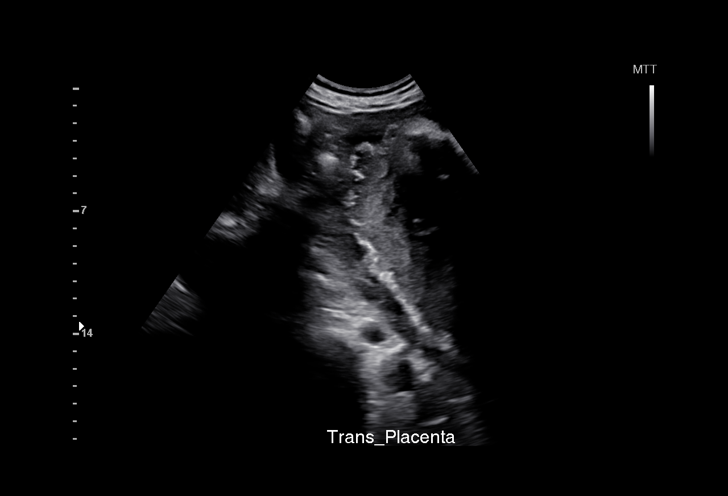
[im 29/41]
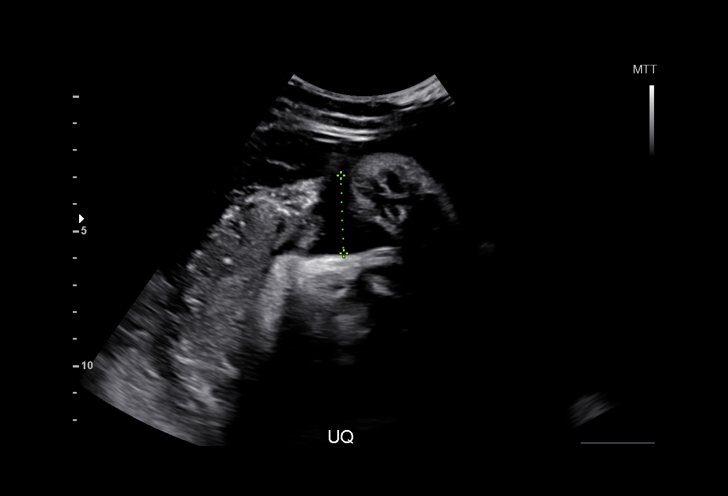
[im 32/41]
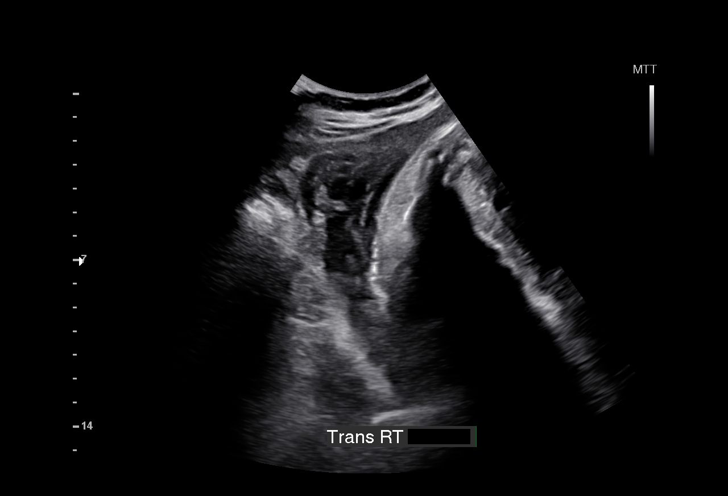
[im 35/41]
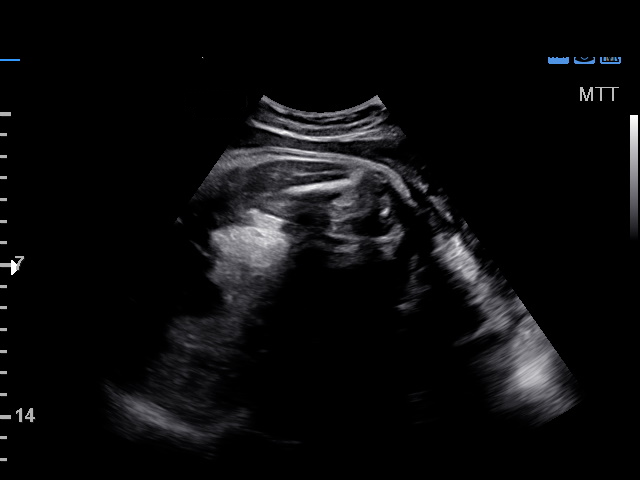
[im 38/41]
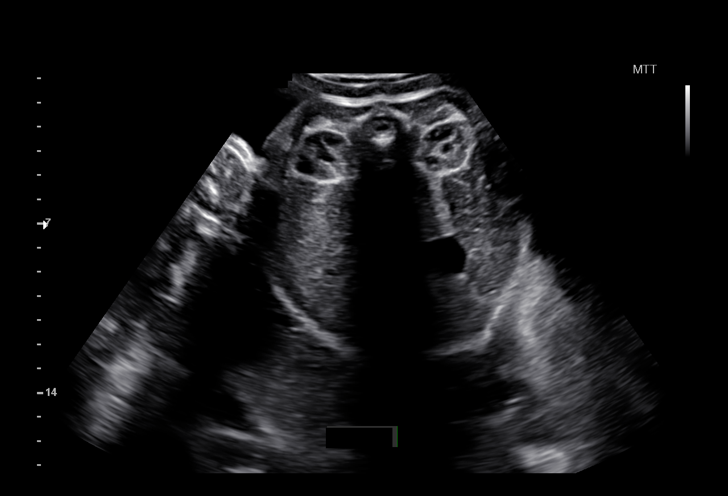
[im 41/41]
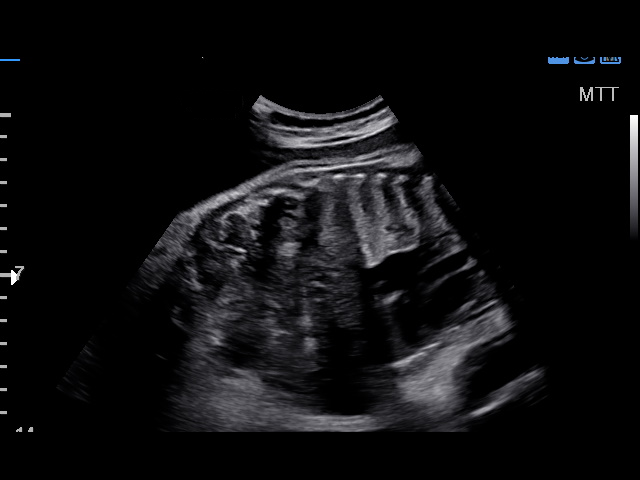

[15 of 28 positions shown; findings below may reference images not displayed]

[REDACTED]-
Faculty Physician
Referred By:      EDNAI FRANCIS-BROWNE        Tertiary Phy.:    [facility]-
MAU/Triage

Indications

Abnormal biochemical screen (quad) for
Trisomy 21 (DSR [DATE])
Advanced maternal age multigravida (44),
third trimester
Previous cesarean section x 3
38 weeks gestation of pregnancy
Non-reactive NST, FHR decelerations
OB History

Gravidity:    4         Term:   3        Prem:   0        SAB:   0
TOP:          0       Ectopic:  0        Living: 3
Fetal Evaluation

Num Of Fetuses:     1
Fetal Heart         131
Rate(bpm):
Cardiac Activity:   Observed
Presentation:       Cephalic
Placenta:           Posterior, above cervical os
P. Cord Insertion:  Previously Visualized

Amniotic Fluid
AFI FV:      Subjectively within normal limits
AFI Sum:     11.05   cm       34  %Tile     Larg Pckt:    2.89  cm
RUQ:   2.68    cm   RLQ:    2.68   cm    LUQ:   2.8     cm   LLQ:    2.89   cm
Biophysical Evaluation

Amniotic F.V:   Within normal limits       F. Tone:        Observed
F. Movement:    Observed                   Score:          [DATE]
F. Breathing:   Observed
Gestational Age

LMP:           35w 1d        Date:  03/11/15                 EDD:   12/16/15
Best:          38w 1d     Det. By:  U/S  (07/17/15)          EDD:   11/25/15
Cervix Uterus Adnexa

Cervix
Not visualized (advanced GA >59wks)

Uterus
No abnormality visualized.

Left Ovary
Not visualized. No adnexal mass visualized.

Right Ovary
Not visualized. No adnexal mass visualized.

Cul De Sac:   No free fluid seen.

Adnexa:       No free fluid.
Impression

Single IUP at 38w 1d, Advanced maternal age > 40 (remote
read of BPP and ultrasound only)
active singleton fetus with BPP [DATE]
Posterior placenta without previa
Normal amniotic fluid volume
Recommendations

Continue antenatal testing, fetal Kumari Goldberger and follow up as
clinically indicated.

## 2021-03-17 DIAGNOSIS — M67432 Ganglion, left wrist: Secondary | ICD-10-CM | POA: Insufficient documentation

## 2021-08-17 ENCOUNTER — Ambulatory Visit: Payer: 59 | Admitting: Cardiology

## 2021-08-17 ENCOUNTER — Other Ambulatory Visit: Payer: Self-pay

## 2021-08-17 ENCOUNTER — Encounter: Payer: Self-pay | Admitting: Cardiology

## 2021-08-17 VITALS — BP 165/89 | HR 68 | Temp 97.5°F | Resp 16 | Ht 63.5 in | Wt 209.1 lb

## 2021-08-17 DIAGNOSIS — R55 Syncope and collapse: Secondary | ICD-10-CM

## 2021-08-17 DIAGNOSIS — I10 Essential (primary) hypertension: Secondary | ICD-10-CM

## 2021-08-17 MED ORDER — AMLODIPINE BESYLATE 10 MG PO TABS: 10.0000 mg | ORAL_TABLET | Freq: Every morning | ORAL | 0 refills | Status: DC

## 2021-08-17 MED ORDER — LISINOPRIL 40 MG PO TABS: 40.0000 mg | ORAL_TABLET | Freq: Every evening | ORAL | 0 refills | Status: DC

## 2021-08-17 NOTE — Progress Notes (Signed)
Date:  08/17/2021   ID:  Jenna Greene, DOB 11-Mar-1971, MRN [id number]  PCP:  Benito Mccreedy, MD  Cardiologist:  Rex Kras, DO, [facility] (established care 08/17/21)  REASON FOR CONSULT: Pre-syncope.   REQUESTING PHYSICIAN:  Osei-Bonsu, Iona Beard, MD [address] [place],  [place] [address]  Chief Complaint  Patient presents with   Near Syncope   HPI  Jenna Greene is a 50 y.o. female who presents to the office with a chief complaint of " near syncope." Patient's past medical history and cardiovascular risk factors include: Hypertension, history of sleep apnea, obesity due to excess calories.  She is referred to the office at the request of Osei-Bonsu, Iona Beard, MD for evaluation of presyncope.  Patient is from [place] and noticed that he states in 2015 and fluently speaks Twi.  He is accompanied by her husband who provides majority of the history of present illness.  Patient has been stated approximately 2 months ago while she was at home she wakes up in the morning to go to the bathroom to urinate and shortly thereafter she had a quick return back to bed as she was experiencing lightheadedness, dizziness.  When her husband went to attend her she was noted to be diaphoretic and clammy.  The symptoms lasted for short duration and self-limited.  No reoccurrence of symptoms since then.  No prior history of syncopal events.  Patient does not recall being dehydrated, exposure to sick contacts, or recent COVID-19 infection.  She does have benign essential hypertension and she takes all of her 3 antihypertensive medications in the morning.  Patient's office blood pressures are not well controlled.  She does not check them at home.  Otherwise she denies any chest pain or shortness of breath at rest or with effort related activities.  FUNCTIONAL STATUS: No structured exercise program or daily routine.    ALLERGIES: Allergies  Allergen Reactions   Pork-Derived Products Other (See  Comments)    MEDICATION LIST PRIOR TO VISIT: Current Meds  Medication Sig   amLODipine (NORVASC) 10 MG tablet Take 1 tablet (10 mg total) by mouth every morning.   diclofenac (VOLTAREN) 75 MG EC tablet Take 75 mg by mouth 2 (two) times daily.   folic acid (FOLVITE) 1 MG tablet Take 1 tablet by mouth daily.   hydrochlorothiazide (HYDRODIURIL) 25 MG tablet Take 1 tablet (25 mg total) by mouth daily.   lisinopril (ZESTRIL) 40 MG tablet Take 1 tablet (40 mg total) by mouth every evening.   [DISCONTINUED] amLODipine-benazepril (LOTREL) 10-40 MG capsule Take 1 capsule by mouth daily.     PAST MEDICAL HISTORY: Past Medical History:  Diagnosis Date   GERD (gastroesophageal reflux disease)    Hypertension    Medical history non-contributory     PAST SURGICAL HISTORY: Past Surgical History:  Procedure Laterality Date   CESAREAN SECTION     CESAREAN SECTION N/A 11/18/2015   Procedure: CESAREAN SECTION;  Surgeon: Osborne Oman, MD;  Location: [facility];  Service: Obstetrics;  Laterality: N/A;   TUBAL LIGATION Bilateral 11/18/2015   Procedure: BILATERAL TUBAL LIGATION;  Surgeon: Osborne Oman, MD;  Location: [facility];  Service: Obstetrics;  Laterality: Bilateral;    FAMILY HISTORY: The patient family history includes Hypertension in her mother.  SOCIAL HISTORY:  The patient  reports that she has never smoked. She has never used smokeless tobacco. She reports that she does not drink alcohol and does not use drugs.  REVIEW OF SYSTEMS: Review of Systems  Constitutional: Negative for chills and fever.  HENT:  Negative for hoarse voice and nosebleeds.   Eyes:  Negative for discharge, double vision and pain.  Cardiovascular:  Positive for near-syncope. Negative for chest pain, claudication, dyspnea on exertion, leg swelling, orthopnea, palpitations, paroxysmal nocturnal dyspnea and syncope.  Respiratory:  Negative for hemoptysis and shortness of breath.   Musculoskeletal:  Negative for  muscle cramps and myalgias.  Gastrointestinal:  Negative for abdominal pain, constipation, diarrhea, hematemesis, hematochezia, melena, nausea and vomiting.  Neurological:  Positive for dizziness and light-headedness.   PHYSICAL EXAM: Vitals with BMI 08/17/2021 08/17/2021 01/08/2016  Height - 5' 3.5" -  Weight - 209 lbs 2 oz -  BMI - 01.09 -  Systolic 323 557 322  Diastolic 89 025 82  Pulse 68 64 -   Orthostatic VS for the past 72 hrs (Last 3 readings):  Orthostatic BP Patient Position BP Location Cuff Size Orthostatic Pulse  08/17/21 0955 (!) 165/106 Standing Left Arm Large 70  08/17/21 0953 (!) 177/98 Sitting Left Arm Large 62  08/17/21 0952 (!) 176/103 Supine Left Arm Large 65     CONSTITUTIONAL: Well-developed and well-nourished. No acute distress.  SKIN: Skin is warm and dry. No rash noted. No cyanosis. No pallor. No jaundice HEAD: Normocephalic and atraumatic.  EYES: No scleral icterus MOUTH/THROAT: Moist oral membranes.  NECK: No JVD present. No thyromegaly noted. No carotid bruits  LYMPHATIC: No visible cervical adenopathy.  CHEST Normal respiratory effort. No intercostal retractions  LUNGS: Clear to auscultation bilaterally.  No stridor. No wheezes. No rales.  CARDIOVASCULAR: Regular, positive S1-S2, no murmurs rubs or gallops appreciated. ABDOMINAL: Soft, obese, nontender, nondistended, Positive bowel sounds in all 4 quadrants, no apparent ascites.  EXTREMITIES: No peripheral edema, 2+ DP pulses bilaterally. HEMATOLOGIC: No significant bruising NEUROLOGIC: Oriented to person, place, and time. Nonfocal. Normal muscle tone.  PSYCHIATRIC: Normal mood and affect. Normal behavior. Cooperative  CARDIAC DATABASE: EKG: 08/17/2021: Normal sinus rhythm, 65 bpm, without underlying ischemia or injury pattern.   Echocardiogram: No results found for this or any previous visit from the past 1095 days.   Stress Testing: No results found for this or any previous visit from the past  1095 days.  Heart Catheterization: None  LABORATORY DATA: Recent labs: 03/25/2021-07/01/2021: Glucose 86, BUN/Cr 10/0.88. EGFR 112. Na/K 142/4.1. Rest of the CMP normal H/H 12.7/38.7. MCV 91.7. Platelets 228 HbA1C 5.5% Chol 136, TG 71, HDL 45, LDL 76 TSH 1.94 normal  IMPRESSION:    ICD-10-CM   1. Benign hypertension  I10 EKG 12-Lead    PCV ECHOCARDIOGRAM COMPLETE    amLODipine (NORVASC) 10 MG tablet    lisinopril (ZESTRIL) 40 MG tablet    2. Near syncope  R55        RECOMMENDATIONS: Jenna Greene is a 50 y.o. female whose past medical history and cardiac risk factors include: Hypertension, history of sleep apnea, obesity due to excess calories.  Patient presents to the office for evaluation of near syncope that occurred approximately 2 months ago.  Since then patient has not had any reoccurrence.  I suspect that the episode that she had was most likely secondary to orthostasis.  She currently takes all of her antihypertensive medications in the morning.  She is currently on amlodipine/lisinopril combination as well as hydrochlorothiazide.  I have asked her to take her hydrochlorothiazide in the morning along with amlodipine.  Take lisinopril at night.  Medications are currently being managed by her primary care provider.  Low-salt diet recommended.  EKG overall is nonischemic.  Would like to proceed with an echocardiogram to evaluate for LVEF and valvular dysfunction.  As long as the EF remains stable and no significant valvular heart disease no additional cardiovascular testing warranted at this time as she does not have any symptoms of ischemic chest pain or heart failure symptoms.  In the interim, if she has additional episodes of near syncope he is asked to call the office or schedule follow-up appointment.  Patient is aware that if she has symptoms of syncope she needs to go to either the closest ER via EMS for further evaluation or be seen in the office.   FINAL  MEDICATION LIST END OF ENCOUNTER: Meds ordered this encounter  Medications   amLODipine (NORVASC) 10 MG tablet    Sig: Take 1 tablet (10 mg total) by mouth every morning.    Dispense:  90 tablet    Refill:  0   lisinopril (ZESTRIL) 40 MG tablet    Sig: Take 1 tablet (40 mg total) by mouth every evening.    Dispense:  90 tablet    Refill:  0     Medications Discontinued During This Encounter  Medication Reason   amLODipine (NORVASC) 10 MG tablet Change in therapy   ibuprofen (ADVIL,MOTRIN) 600 MG tablet Error   ibuprofen (ADVIL,MOTRIN) 800 MG tablet Error   oxyCODONE-acetaminophen (PERCOCET/ROXICET) 5-325 MG tablet Error   Prenatal Vit-Fe Fumarate-FA (PRENATAL MULTIVITAMIN) TABS tablet Error   Prenatal Vit-Fe Fumarate-FA (PRENATAL VITAMIN) 27-0.8 MG TABS Error   amLODipine-benazepril (LOTREL) 10-40 MG capsule Change in therapy     Current Outpatient Medications:    amLODipine (NORVASC) 10 MG tablet, Take 1 tablet (10 mg total) by mouth every morning., Disp: 90 tablet, Rfl: 0   diclofenac (VOLTAREN) 75 MG EC tablet, Take 75 mg by mouth 2 (two) times daily., Disp: , Rfl:    folic acid (FOLVITE) 1 MG tablet, Take 1 tablet by mouth daily., Disp: , Rfl:    hydrochlorothiazide (HYDRODIURIL) 25 MG tablet, Take 1 tablet (25 mg total) by mouth daily., Disp: 30 tablet, Rfl: 6   lisinopril (ZESTRIL) 40 MG tablet, Take 1 tablet (40 mg total) by mouth every evening., Disp: 90 tablet, Rfl: 0  Orders Placed This Encounter  Procedures   EKG 12-Lead   PCV ECHOCARDIOGRAM COMPLETE    There are no Patient Instructions on file for this visit.   --Continue cardiac medications as reconciled in final medication list. --Return in about 3 months (around 11/16/2021) for Follow up near syncope and , Review test results. Or sooner if needed. --Continue follow-up with your primary care physician regarding the management of your other chronic comorbid conditions.  Patient's questions and concerns were  addressed to her satisfaction. She voices understanding of the instructions provided during this encounter.   This note was created using a voice recognition software as a result there may be grammatical errors inadvertently enclosed that do not reflect the nature of this encounter. Every attempt is made to correct such errors.  Rex Kras, [place], [facility]  Pager: [phone] Office: [phone]

## 2021-08-31 ENCOUNTER — Ambulatory Visit: Payer: 59

## 2021-08-31 ENCOUNTER — Other Ambulatory Visit: Payer: Self-pay

## 2021-08-31 DIAGNOSIS — I10 Essential (primary) hypertension: Secondary | ICD-10-CM

## 2021-09-03 NOTE — Progress Notes (Signed)
Called patient, Na, LMAM.

## 2021-09-07 NOTE — Progress Notes (Signed)
Called and spoke to pts husband, he voiced understanding and will let the pt know.

## 2021-11-10 ENCOUNTER — Encounter: Payer: Self-pay | Admitting: Internal Medicine

## 2021-11-17 ENCOUNTER — Ambulatory Visit: Payer: 59 | Admitting: Cardiology

## 2021-11-17 ENCOUNTER — Encounter: Payer: Self-pay | Admitting: Cardiology

## 2021-11-17 ENCOUNTER — Other Ambulatory Visit: Payer: Self-pay

## 2021-11-17 VITALS — BP 142/83 | HR 75 | Temp 97.1°F | Resp 16 | Ht 63.5 in | Wt 208.0 lb

## 2021-11-17 DIAGNOSIS — Z712 Person consulting for explanation of examination or test findings: Secondary | ICD-10-CM

## 2021-11-17 DIAGNOSIS — R55 Syncope and collapse: Secondary | ICD-10-CM

## 2021-11-17 DIAGNOSIS — I10 Essential (primary) hypertension: Secondary | ICD-10-CM

## 2021-11-17 NOTE — Progress Notes (Signed)
Date:  11/17/2021   ID:  Wilmer Floor, DOB 1971/05/03, MRN [id number]  PCP:  Benito Mccreedy, MD  Cardiologist:  Rex Kras, DO, [facility] (established care 08/17/21)  Date: 11/17/21 Last Office Visit: 08/17/2021  Chief Complaint  Patient presents with   Near Syncope   Results   Follow-up    3 months   HPI  Jenna Greene is a 50 y.o. female who presents to the office with a chief complaint of " 49-monthfollow-up for near-syncope and review test results" Patient's past medical history and cardiovascular risk factors include: Hypertension, history of sleep apnea, obesity due to excess calories.  She is referred to the office at the request of Osei-Bonsu, GIona Beard MD for evaluation of presyncope.  [place] fluently [place].  He is accompanied by her husband who provides majority of the history of present illness.  In August/September 2022 patient was having episodes of near syncope most likely secondary to orthostasis was referred to cardiology for further evaluation and management.  At the last office visit that shared decision was to proceed with an echocardiogram to evaluate for structural heart disease and LVEF.  And she was taking all of her antihypertensive medications in the morning which may be contributing to her orthostasis.  Therefore I recommended that she take hydrochlorothiazide and amlodipine in the morning and lisinopril at night.  She now presents for 389-monthollow-up visit.  She has not had any reoccurrence of near syncope or syncopal event.  She denies any anginal discomfort or heart failure symptoms.  Patient states that since splitting her antihypertensive medications in the morning and evening hours she is having less episodes of lightheaded dizziness.  Her systolic blood pressures at home are ranging between 125-135 mmHg based on her blood pressure log.  FUNCTIONAL STATUS: No structured exercise program or daily  routine.    ALLERGIES: Allergies  Allergen Reactions   Pork-Derived Products Other (See Comments)    MEDICATION LIST PRIOR TO VISIT: Current Meds  Medication Sig   amLODipine (NORVASC) 10 MG tablet Take 1 tablet (10 mg total) by mouth every morning.   diclofenac (VOLTAREN) 75 MG EC tablet Take 75 mg by mouth 2 (two) times daily.   folic acid (FOLVITE) 1 MG tablet Take 1 tablet by mouth daily.   hydrochlorothiazide (HYDRODIURIL) 25 MG tablet Take 1 tablet (25 mg total) by mouth daily.   lisinopril (ZESTRIL) 40 MG tablet Take 1 tablet (40 mg total) by mouth every evening.     PAST MEDICAL HISTORY: Past Medical History:  Diagnosis Date   GERD (gastroesophageal reflux disease)    Hypertension    Medical history non-contributory     PAST SURGICAL HISTORY: Past Surgical History:  Procedure Laterality Date   CESAREAN SECTION     CESAREAN SECTION N/A 11/18/2015   Procedure: CESAREAN SECTION;  Surgeon: UgOsborne OmanMD;  [facility];  Service: Obstetrics;  Laterality: N/A;   TUBAL LIGATION Bilateral 11/18/2015   Procedure: BILATERAL TUBAL LIGATION;  Surgeon: UgOsborne OmanMD;  [facility];  Service: Obstetrics;  Laterality: Bilateral;    FAMILY HISTORY: The patient family history includes Hypertension in her mother.  SOCIAL HISTORY:  The patient  reports that she has never smoked. She has never used smokeless tobacco. She reports that she does not drink alcohol and does not use drugs.  REVIEW OF SYSTEMS: Review of Systems  Constitutional: Negative for chills and fever.  HENT:  Negative for hoarse voice and nosebleeds.   Eyes:  Negative for discharge, double vision and pain.  Cardiovascular:  Negative for chest pain, claudication, dyspnea on exertion, leg swelling, near-syncope, orthopnea, palpitations, paroxysmal nocturnal dyspnea and syncope.  Respiratory:  Negative for hemoptysis and shortness of breath.   Musculoskeletal:  Negative for muscle cramps and  myalgias.  Gastrointestinal:  Negative for abdominal pain, constipation, diarrhea, hematemesis, hematochezia, melena, nausea and vomiting.  Neurological:  Negative for dizziness and light-headedness.   PHYSICAL EXAM: Vitals with BMI 11/17/2021 08/17/2021 08/17/2021  Height 5' 3.5" - 5' 3.5"  Weight 208 lbs - 209 lbs 2 oz  BMI 65.46 - 50.35  Systolic 465 681 275  Diastolic 83 89 170  Pulse 75 68 64   CONSTITUTIONAL: Well-developed and well-nourished. No acute distress.  SKIN: Skin is warm and dry. No rash noted. No cyanosis. No pallor. No jaundice HEAD: Normocephalic and atraumatic.  EYES: No scleral icterus MOUTH/THROAT: Moist oral membranes.  NECK: No JVD present. No thyromegaly noted. No carotid bruits  LYMPHATIC: No visible cervical adenopathy.  CHEST Normal respiratory effort. No intercostal retractions  LUNGS: Clear to auscultation bilaterally.  No stridor. No wheezes. No rales.  CARDIOVASCULAR: Regular, positive S1-S2, no murmurs rubs or gallops appreciated. ABDOMINAL: Soft, obese, nontender, nondistended, Positive bowel sounds in all 4 quadrants, no apparent ascites.  EXTREMITIES: No peripheral edema, 2+ DP pulses bilaterally. HEMATOLOGIC: No significant bruising NEUROLOGIC: Oriented to person, place, and time. Nonfocal. Normal muscle tone.  PSYCHIATRIC: Normal mood and affect. Normal behavior. Cooperative  CARDIAC DATABASE: EKG: 08/17/2021: Normal sinus rhythm, 65 bpm, without underlying ischemia or injury pattern.   Echocardiogram: 08/31/2021: Left ventricle cavity is normal in size. Mild concentric hypertrophy of the left ventricle. Normal global wall motion. Normal LV systolic function with EF 59%. Doppler evidence of grade I (impaired) diastolic dysfunction, normal LAP. No significant valvular abnormality. Normal right atrial pressure.    Stress Testing: No results found for this or any previous visit from the past 1095 days.  Heart  Catheterization: None  LABORATORY DATA: Recent labs: 03/25/2021-07/01/2021: Glucose 86, BUN/Cr 10/0.88. EGFR 112. Na/K 142/4.1. Rest of the CMP normal H/H 12.7/38.7. MCV 91.7. Platelets 228 HbA1C 5.5% Chol 136, TG 71, HDL 45, LDL 76 TSH 1.94 normal  IMPRESSION:    ICD-10-CM   1. Near syncope  R55     2. Encounter to discuss test results  Z71.2     3. Benign hypertension  I10         RECOMMENDATIONS: Jenna Greene is a 50 y.o. female whose past medical history and cardiac risk factors include: Hypertension, history of sleep apnea, obesity due to excess calories.  Patient had episodes of near syncope in August/September 2022 most likely secondary to orthostasis.  At the last office visit she was recommended to take her antihypertensive medications such that somewhere in the morning and some in the evening.  Since then her blood pressures are well controlled and she has not had any episodes of lightheaded and dizziness since last office encounter.  Patient denies any near-syncope or syncopal events.  She denies any angina pectoris or heart failure symptoms.  She had an echocardiogram since last office visit which notes stable LVEF, grade 1 diastolic impairment and no significant valvular heart disease.  From a cardiovascular standpoint no additional recommendations warranted at this time.  Would recommend follow-up in 1 year or as needed basis.  I will refer her back to her PCP for the management of  her other chronic comorbid conditions.  Patient is more than welcome to follow-up sooner if there is change in clinical status requiring cardiovascular attention.   FINAL MEDICATION LIST END OF ENCOUNTER: No orders of the defined types were placed in this encounter.    There are no discontinued medications.    Current Outpatient Medications:    amLODipine (NORVASC) 10 MG tablet, Take 1 tablet (10 mg total) by mouth every morning., Disp: 90 tablet, Rfl: 0   diclofenac (VOLTAREN)  75 MG EC tablet, Take 75 mg by mouth 2 (two) times daily., Disp: , Rfl:    folic acid (FOLVITE) 1 MG tablet, Take 1 tablet by mouth daily., Disp: , Rfl:    hydrochlorothiazide (HYDRODIURIL) 25 MG tablet, Take 1 tablet (25 mg total) by mouth daily., Disp: 30 tablet, Rfl: 6   lisinopril (ZESTRIL) 40 MG tablet, Take 1 tablet (40 mg total) by mouth every evening., Disp: 90 tablet, Rfl: 0  No orders of the defined types were placed in this encounter.   There are no Patient Instructions on file for this visit.   --Continue cardiac medications as reconciled in final medication list. --Return in about 1 year (around 11/17/2022) for Follow up . Or sooner if needed. --Continue follow-up with your primary care physician regarding the management of your other chronic comorbid conditions.  Patient's questions and concerns were addressed to her satisfaction. She voices understanding of the instructions provided during this encounter.   This note was created using a voice recognition software as a result there may be grammatical errors inadvertently enclosed that do not reflect the nature of this encounter. Every attempt is made to correct such errors.  Total time spent: 22 minutes.  Rex Kras, [place], [facility]  Pager: [phone] Office: [phone]

## 2022-06-30 ENCOUNTER — Encounter: Payer: Self-pay | Admitting: Nurse Practitioner

## 2022-06-30 ENCOUNTER — Ambulatory Visit: Payer: Self-pay | Admitting: Nurse Practitioner

## 2022-06-30 VITALS — BP 146/90 | HR 86

## 2022-06-30 DIAGNOSIS — R252 Cramp and spasm: Secondary | ICD-10-CM

## 2022-06-30 DIAGNOSIS — M65331 Trigger finger, right middle finger: Secondary | ICD-10-CM

## 2022-06-30 NOTE — Progress Notes (Signed)
  Acute Office Visit  Subjective:     Patient ID: Jenna Greene, female    DOB: 1971/10/26, 51 y.o.   MRN: [id number]  Chief Complaint  Patient presents with   Hand Pain   Patient is in today for complaints of right hand pain. Reports pain in located specifically in her middle finger. Pain started 2 months ago. Reports she cannot bend or straighten finger without pain. Pain is worse in the morning. Denies pain in any other fingers of both hands. 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 Takes tylenol for pain as needed.  Also reports right lower leg pain. Reports leg feels "hot" at night when she is about to sleep. Sometimes pain is worse with activity. States that her lower leg hurts when crosses her legs. No pain in left leg. Denies injury or trauma to site.  Chart reveals hx of hypokalemia May 2023- she completed replacement. Admits to muscle cramps.   Review of Systems  Constitutional:  Negative for fever.  Respiratory:  Negative for shortness of breath.   Cardiovascular:  Negative for chest pain and leg swelling.  Musculoskeletal:  Negative for falls.  Neurological:  Positive for headaches.        Objective:    BP (!) 146/90   Pulse 86   SpO2 95%    Physical Exam Constitutional:      General: She is not in acute distress. Cardiovascular:     Rate and Rhythm: Normal rate and regular rhythm.     Heart sounds: Normal heart sounds.  Pulmonary:     Effort: Pulmonary effort is normal.     Breath sounds: Normal breath sounds.  Musculoskeletal:     Right hand: No swelling, deformity or tenderness. Decreased range of motion (middle finger-due to pain). Normal strength. Normal pulse.     Left hand: Normal. No swelling or deformity.     Right lower leg: Normal. No swelling, deformity (no signs of injury. No erythema to site.) or tenderness. No edema.     Left lower leg: Normal. No swelling, deformity or tenderness. No edema.  Neurological:     Mental  Status: She is alert.     No results found for any visits on 06/30/22.     Assessment & Plan:   Problem List Items Addressed This Visit   None  Evolet was seen today for hand pain/ leg complaints.  Diagnoses and all orders for this visit:  Trigger middle finger of right hand Recommended NSAIDS for pain and follow-up with PCP for steroid injection if appropriate/ referral to ortho.   Muscle cramping Suspect leg c/o secondary to muscle aches/overuse; however due to hx of hypokalemia an muscle cramps will obtain labs today,  -     Basic Metabolic Panel -     Magnesium    No orders of the defined types were placed in this encounter.   Follow-up as needed.   Gloris Ham, NP

## 2022-07-01 LAB — MAGNESIUM: Magnesium: 1.9 mg/dL (ref 1.5–2.5)

## 2022-07-01 LAB — BASIC METABOLIC PANEL
BUN: 14 mg/dL (ref 7–25)
CO2: 30 mmol/L (ref 20–32)
Calcium: 9.6 mg/dL (ref 8.6–10.4)
Chloride: 102 mmol/L (ref 98–110)
Creat: 0.75 mg/dL (ref 0.50–1.03)
Glucose, Bld: 97 mg/dL (ref 65–99)
Potassium: 3.1 mmol/L — ABNORMAL LOW (ref 3.5–5.3)
Sodium: 140 mmol/L (ref 135–146)

## 2022-07-02 ENCOUNTER — Emergency Department (HOSPITAL_COMMUNITY)
Admission: EM | Admit: 2022-07-02 | Discharge: 2022-07-03 | Disposition: A | Discharge status: Home / Self Care | Payer: Commercial Managed Care - HMO | Attending: Emergency Medicine | Admitting: Emergency Medicine

## 2022-07-02 ENCOUNTER — Other Ambulatory Visit: Payer: Self-pay

## 2022-07-02 ENCOUNTER — Emergency Department (HOSPITAL_COMMUNITY): Payer: Commercial Managed Care - HMO

## 2022-07-02 DIAGNOSIS — I10 Essential (primary) hypertension: Secondary | ICD-10-CM | POA: Diagnosis not present

## 2022-07-02 DIAGNOSIS — E876 Hypokalemia: Secondary | ICD-10-CM | POA: Insufficient documentation

## 2022-07-02 DIAGNOSIS — S63254A Unspecified dislocation of right ring finger, initial encounter: Secondary | ICD-10-CM | POA: Diagnosis not present

## 2022-07-02 DIAGNOSIS — R55 Syncope and collapse: Secondary | ICD-10-CM | POA: Diagnosis not present

## 2022-07-02 DIAGNOSIS — Y9301 Activity, walking, marching and hiking: Secondary | ICD-10-CM | POA: Insufficient documentation

## 2022-07-02 DIAGNOSIS — Z79899 Other long term (current) drug therapy: Secondary | ICD-10-CM | POA: Diagnosis not present

## 2022-07-02 DIAGNOSIS — S60417A Abrasion of left little finger, initial encounter: Secondary | ICD-10-CM | POA: Diagnosis not present

## 2022-07-02 DIAGNOSIS — S63259A Unspecified dislocation of unspecified finger, initial encounter: Secondary | ICD-10-CM

## 2022-07-02 DIAGNOSIS — S59911A Unspecified injury of right forearm, initial encounter: Secondary | ICD-10-CM | POA: Insufficient documentation

## 2022-07-02 DIAGNOSIS — S80211A Abrasion, right knee, initial encounter: Secondary | ICD-10-CM | POA: Diagnosis not present

## 2022-07-02 DIAGNOSIS — S61214A Laceration without foreign body of right ring finger without damage to nail, initial encounter: Secondary | ICD-10-CM | POA: Insufficient documentation

## 2022-07-02 DIAGNOSIS — Z23 Encounter for immunization: Secondary | ICD-10-CM | POA: Diagnosis not present

## 2022-07-02 DIAGNOSIS — W19XXXA Unspecified fall, initial encounter: Secondary | ICD-10-CM

## 2022-07-02 DIAGNOSIS — W01198A Fall on same level from slipping, tripping and stumbling with subsequent striking against other object, initial encounter: Secondary | ICD-10-CM | POA: Insufficient documentation

## 2022-07-02 DIAGNOSIS — S6991XA Unspecified injury of right wrist, hand and finger(s), initial encounter: Secondary | ICD-10-CM | POA: Diagnosis present

## 2022-07-02 LAB — CBC
HCT: 37.6 % (ref 36.0–46.0)
Hemoglobin: 12.9 g/dL (ref 12.0–15.0)
MCH: 31.8 pg (ref 26.0–34.0)
MCHC: 34.3 g/dL (ref 30.0–36.0)
MCV: 92.6 fL (ref 80.0–100.0)
Platelets: 282 10*3/uL (ref 150–400)
RBC: 4.06 MIL/uL (ref 3.87–5.11)
RDW: 12.6 % (ref 11.5–15.5)
WBC: 5.8 10*3/uL (ref 4.0–10.5)
nRBC: 0 % (ref 0.0–0.2)

## 2022-07-02 LAB — URINALYSIS, ROUTINE W REFLEX MICROSCOPIC
Bilirubin Urine: NEGATIVE
Glucose, UA: NEGATIVE mg/dL
Hgb urine dipstick: NEGATIVE
Ketones, ur: NEGATIVE mg/dL
Leukocytes,Ua: NEGATIVE
Nitrite: NEGATIVE
Protein, ur: NEGATIVE mg/dL
Specific Gravity, Urine: 1.02 (ref 1.005–1.030)
pH: 5 (ref 5.0–8.0)

## 2022-07-02 LAB — BASIC METABOLIC PANEL
Anion gap: 11 (ref 5–15)
BUN: 12 mg/dL (ref 6–20)
CO2: 26 mmol/L (ref 22–32)
Calcium: 9.3 mg/dL (ref 8.9–10.3)
Chloride: 102 mmol/L (ref 98–111)
Creatinine, Ser: 0.81 mg/dL (ref 0.44–1.00)
GFR, Estimated: >60 mL/min (ref >60)
Glucose, Bld: 116 mg/dL — ABNORMAL HIGH (ref 70–99)
Potassium: 3.2 mmol/L — ABNORMAL LOW (ref 3.5–5.1)
Sodium: 139 mmol/L (ref 135–145)

## 2022-07-02 MED ORDER — TETANUS-DIPHTH-ACELL PERTUSSIS 5-2.5-18.5 LF-MCG/0.5 IM SUSY
0.5000 mL | PREFILLED_SYRINGE | Freq: Once | INTRAMUSCULAR | Status: AC
  Administered 2022-07-03: 0.5 mL via INTRAMUSCULAR
  Filled 2022-07-02: qty 0.5

## 2022-07-02 MED ORDER — LIDOCAINE HCL (PF) 1 % IJ SOLN
10.0000 mL | Freq: Once | INTRAMUSCULAR | Status: AC
  Administered 2022-07-03: 10 mL
  Filled 2022-07-02: qty 10

## 2022-07-02 MED ORDER — POTASSIUM CHLORIDE CRYS ER 20 MEQ PO TBCR
40.0000 meq | EXTENDED_RELEASE_TABLET | Freq: Once | ORAL | Status: AC
  Administered 2022-07-03: 40 meq via ORAL
  Filled 2022-07-02: qty 2

## 2022-07-02 MED ORDER — OXYCODONE-ACETAMINOPHEN 5-325 MG PO TABS
1.0000 | ORAL_TABLET | Freq: Once | ORAL | Status: AC
  Administered 2022-07-03: 1 via ORAL
  Filled 2022-07-02: qty 1

## 2022-07-02 NOTE — ED Triage Notes (Signed)
Pt from home with husband-reports she had an unwitnessed fall and possible LOC. Pt states she doesn't know what happened and there was nothing for her to trip over but she just woke up on the ground. Pt has lac and swelling to R ring finger.

## 2022-07-02 NOTE — ED Provider Notes (Signed)
[facility] EMERGENCY DEPARTMENT Provider Note   CSN: [id number] Arrival date & time: 07/02/22  1721     History {Add pertinent medical, surgical, social history, OB history to HPI:1} Chief Complaint  Patient presents with   Loss of Consciousness   Finger Injury    Jenna Greene is a 51 y.o. female with a history of hypertension, S/p tubal ligation, and GERD who presents to the ED S/p fall vs. Syncope shortly PTA. Patient's primary language is Twi, [employer] forces technical [employer], she and her husband are adamantly refusing and would like her husband to translate, patient does speak some english.  Per patient's husband it was a normal day for her, was eating/drinking fine, went outside and was walking down the driveway and then she was on the ground facedown. She hit her head, she is unsure if she had LOC. Reports injuries to her right 4th finger, right forearm, left 5th finger, and her right knee including wounds to the digits. Denies prodromal lightheadedness, dizziness, chest pain or dyspnea-- she is unsure if she passed out or tripped but there was nothing for her to trip over per say. She has been feeling okay other than her injuries otherwise. She has had a syncopal episode in the past w/ cardiac work up that was reassuring 2 years prior. She is not on blood thinners.  HPI     Home Medications Prior to Admission medications   Medication Sig Start Date End Date Taking? Authorizing Provider  amLODipine (NORVASC) 10 MG tablet 1 tab(s) orally once a day for 90 days    [provider]  diclofenac (VOLTAREN) 75 MG EC tablet Take 75 mg by mouth 2 (two) times daily.    [provider]  folic acid (FOLVITE) 1 MG tablet Take 1 tablet by mouth daily.    [provider]  losartan-hydrochlorothiazide (HYZAAR) 100-25 MG tablet Take 1 tablet by mouth daily. 06/06/22   [provider]      Allergies    Pork-derived products    Review of Systems   Review  of Systems  Constitutional:  Negative for fever.  Respiratory:  Negative for shortness of breath.   Cardiovascular:  Negative for chest pain.  Gastrointestinal:  Negative for abdominal pain and vomiting.  Musculoskeletal:  Positive for arthralgias. Negative for back pain and neck pain.  Skin:  Positive for wound.  Neurological:  Positive for syncope (+/-) and headaches.  All other systems reviewed and are negative.   Physical Exam Updated Vital Signs BP (!) 174/96 (BP Location: Right Arm)   Pulse 83   Temp (!) 97.5 F (36.4 C)   Resp 18   SpO2 98%  Physical Exam Vitals and nursing note reviewed.  Constitutional:      General: She is not in acute distress.    Appearance: She is not toxic-appearing.  HENT:     Head: Normocephalic and atraumatic.  Eyes:     Extraocular Movements: Extraocular movements intact.     Pupils: Pupils are equal, round, and reactive to light.  Cardiovascular:     Rate and Rhythm: Normal rate and regular rhythm.     Heart sounds: No murmur heard. Pulmonary:     Effort: Pulmonary effort is normal.     Breath sounds: Normal breath sounds.  Chest:     Chest wall: No tenderness.  Abdominal:     General: There is no distension.     Palpations: Abdomen is soft.     Tenderness: There is  no abdominal tenderness. There is no guarding or rebound.  Musculoskeletal:     Cervical back: Normal range of motion and neck supple.     Comments: Upper extremities: Patient has an obvious deformity to the right fourth digit, there is a 1.5 cm laceration to the ventral aspect of the fourth proximal phalanx without active bleeding or visible foreign bodies.  She also has an abrasion to the dorsal aspect of the left fifth PIP joint.  No active bleeding or visible foreign bodies.  She has intact active range of motion throughout the digits with the exception of the fourth digit MCP and IP joints.  She is tender to palpation to the left fifth digit as well as the right fourth  and third digits in the right forearm.  Otherwise nontender.  No anatomical snuffbox tenderness. Back: No midline tenderness Lower extremities: Abrasion to the right anterior knee.  No active bleeding.  Intact active range of motion throughout all major joints.  Tender to palpation to the right anterior knee.  Otherwise nontender.  Lymphadenopathy:     Cervical: No cervical adenopathy.  Neurological:     Mental Status: She is alert.     Comments: Alert.  Clear speech.  Sensation and strength grossly intact x4.  Ambulatory with steady gait.           ED Results / Procedures / Treatments   Labs (all labs ordered are listed, but only abnormal results are displayed) Labs Reviewed  BASIC METABOLIC PANEL - Abnormal; Notable for the following components:      Result Value   Potassium 3.2 (*)    Glucose, Bld 116 (*)    All other components within normal limits  URINALYSIS, ROUTINE W REFLEX MICROSCOPIC - Abnormal; Notable for the following components:   APPearance HAZY (*)    All other components within normal limits  CBC  I-STAT BETA HCG BLOOD, ED ([facility], [facility], [facility] ONLY)  CBG MONITORING, ED    EKG None  Radiology CT HEAD WO CONTRAST  Result Date: 07/02/2022 CLINICAL DATA:  Syncope. Unwitnessed fall. Possible loss of consciousness. EXAM: CT HEAD WITHOUT CONTRAST TECHNIQUE: Contiguous axial images were obtained from the base of the skull through the vertex without intravenous contrast. RADIATION DOSE REDUCTION: This exam was performed according to the departmental dose-optimization program which includes automated exposure control, adjustment of the mA and/or kV according to patient size and/or use of iterative reconstruction technique. COMPARISON:  None Available. FINDINGS: Brain: No evidence of acute infarction, hemorrhage, hydrocephalus, extra-axial collection or mass lesion/mass effect. Vascular: No hyperdense vessel or unexpected calcification. Skull: Normal. Negative for fracture or  focal lesion. Sinuses/Orbits: No acute finding. Other: No other abnormalities. IMPRESSION: No acute intracranial abnormalities. Electronically Signed   By: Gerome Sam III M.D.   On: 07/02/2022 18:31    Procedures Procedures  {Document cardiac monitor, telemetry assessment procedure when appropriate:1}  Medications Ordered in ED Medications - No data to display  ED Course/ Medical Decision Making/ A&P                           Medical Decision Making Amount and/or Complexity of Data Reviewed Labs: ordered. Radiology: ordered.  Risk Prescription drug management.   Patient presents to the emergency department status post fall versus syncope. Hypertensive, vitals otherwise fairly unremarkable. Triage work-up has been reviewed and interpreted by me including: Imaging---CT head without contrast: No acute intracranial abnormalities Labs-- CBC w/o anemia, BMP w/ mild  hypokalemia- oral replacement. ***  EKG:***  External records reviewed including cardiology note from September 2022: Evaluated for near syncope, subsequently had an echocardiogram performed which showed mild LVH, normal global wall motion, EF of 59%, grade 1 diastolic dysfunction, no significant valvular disease.  Followed up in December 2022, given reassuring echocardiogram no additional recommendations warranted from a cardiovascular standpoint was recommended for 1 year follow-up or as needed basis.   Open PIP joint dislocation      {Document critical care time when appropriate:1} {Document review of labs and clinical decision tools ie heart score, Chads2Vasc2 etc:1}  {Document your independent review of radiology images, and any outside records:1} {Document your discussion with family members, caretakers, and with consultants:1} {Document social determinants of health affecting pt's care:1} {Document your decision making why or why not admission, treatments were needed:1} Final Clinical Impression(s) / ED  Diagnoses Final diagnoses:  None    Rx / DC Orders ED Discharge Orders     None

## 2022-07-03 ENCOUNTER — Emergency Department (HOSPITAL_COMMUNITY): Payer: Commercial Managed Care - HMO

## 2022-07-03 MED ORDER — CEPHALEXIN 500 MG PO CAPS: 500.0000 mg | ORAL_CAPSULE | Freq: Four times a day (QID) | ORAL | 0 refills | Status: DC

## 2022-07-03 MED ORDER — NAPROXEN 500 MG PO TABS: 500.0000 mg | ORAL_TABLET | Freq: Two times a day (BID) | ORAL | 0 refills | Status: DC | PRN

## 2022-07-03 MED ORDER — CEPHALEXIN 250 MG PO CAPS
500.0000 mg | ORAL_CAPSULE | Freq: Once | ORAL | Status: AC
  Administered 2022-07-03: 500 mg via ORAL
  Filled 2022-07-03: qty 2

## 2022-07-03 MED ORDER — NAPROXEN 250 MG PO TABS
500.0000 mg | ORAL_TABLET | Freq: Once | ORAL | Status: AC
  Administered 2022-07-03: 500 mg via ORAL
  Filled 2022-07-03: qty 2

## 2022-07-03 NOTE — Discharge Instructions (Addendum)
You were seen in the emergency department after either falling or passing out.  Your blood work showed your potassium was a bit low, please include potassium rich foods in your diet per attached guidelines, have this rechecked by your primary care provider.  Your imaging showed a dislocation of your right fourth finger, this was reduced in the emergency department.  Your tetanus was updated.  Your wound was closed with 3 stitches.  Please keep the splint applied clean and dry and in place until you follow-up with hand surgeon Dr. Yehuda Budd, call the office Monday morning to schedule soonest available follow-up appointment.  Stitches will need to be removed per his instruction.   We are sending you home with naproxen to help with pain and Keflex to help with the infection.. - Naproxen- this is a nonsteroidal anti-inflammatory medication that will help with pain and swelling. Be sure to take this medication as prescribed with food, 1 pill every 12 hours,  It should be taken with food, as it can cause stomach upset, and more seriously, stomach bleeding. Do not take other nonsteroidal anti-inflammatory medications with this such as Advil, Motrin, Aleve, Mobic, Goodie Powder, or Motrin etc..    -Keflex-this is an antibiotic to take 4 times per day for the next 1 week.  You make take Tylenol per over the counter dosing with these medications.   We have prescribed you new medication(s) today. Discuss the medications prescribed today with your pharmacist as they can have adverse effects and interactions with your other medicines including over the counter and prescribed medications. Seek medical evaluation if you start to experience new or abnormal symptoms after taking one of these medicines, seek care immediately if you start to experience difficulty breathing, feeling of your throat closing, facial swelling, or rash as these could be indications of a more serious allergic reaction  Please call Dr. Yehuda Budd office  as well as your primary care provider's office on Monday.  Return to the emergency department for any new or worsening symptoms including but not limited to new or worsening pain, chest pain, abdominal pain, blood in your urine/stool, trouble breathing, fever, passing out, dizziness, additionally return if your right fingers become numb/tingly or have significant pain or discoloration if this is the case please loosen the splint and come to the ER.  Return for any other concerns.

## 2022-07-03 NOTE — Progress Notes (Signed)
Orthopedic Tech Progress Note Patient Details:  Jenna Greene 1971-04-08 [id number]  Ortho Devices Type of Ortho Device: Arm sling, Ulna gutter splint Ortho Device/Splint Location: rue Ortho Device/Splint Interventions: Ordered, Application, Adjustment   Post Interventions Patient Tolerated: Well Instructions Provided: Care of device, Adjustment of device  Trinna Post 07/03/2022, 1:38 AM

## 2022-09-29 ENCOUNTER — Other Ambulatory Visit: Payer: Self-pay | Admitting: Nurse Practitioner

## 2022-09-29 DIAGNOSIS — Z1231 Encounter for screening mammogram for malignant neoplasm of breast: Secondary | ICD-10-CM

## 2022-09-29 NOTE — Progress Notes (Signed)
Patient is interested in obtaining screening mammogram via mobile mammogram bus at [facility]. No concerns at this time. Last mammogram: several years ago.  Order placed.

## 2022-10-05 ENCOUNTER — Ambulatory Visit
Admission: RE | Admit: 2022-10-05 | Discharge: 2022-10-05 | Disposition: A | Discharge status: Home / Self Care | Payer: Commercial Managed Care - HMO | Source: Ambulatory Visit | Attending: Nurse Practitioner | Admitting: Nurse Practitioner

## 2022-10-05 DIAGNOSIS — Z1231 Encounter for screening mammogram for malignant neoplasm of breast: Secondary | ICD-10-CM

## 2022-10-06 ENCOUNTER — Ambulatory Visit: Payer: Commercial Managed Care - HMO | Admitting: Nurse Practitioner

## 2022-10-06 DIAGNOSIS — Z23 Encounter for immunization: Secondary | ICD-10-CM

## 2022-10-06 NOTE — Progress Notes (Signed)
1st Dose of Shingrix given today on right arm. Lot Number: [id number], expires 08/31/24. CDC VIS given prior to injection.  Tolerated well. No immediate adverse reactions noted. Educated and discussed red flag symptoms and to call 911 or go to emergency room with signs of anaphylaxis.  Take OTC tylenol as needed for expected side effects such as muscle aches or fever.  Patient given the chance to ask all questions and discussed answers.  RTC in 2 months for 2nd dose of shingrix vaccine to complete series, or sooner as needed.

## 2022-10-14 ENCOUNTER — Other Ambulatory Visit: Payer: Self-pay | Admitting: Nurse Practitioner

## 2022-10-14 DIAGNOSIS — R928 Other abnormal and inconclusive findings on diagnostic imaging of breast: Secondary | ICD-10-CM

## 2022-11-02 ENCOUNTER — Ambulatory Visit
Admission: RE | Admit: 2022-11-02 | Discharge: 2022-11-02 | Disposition: A | Discharge status: Home / Self Care | Payer: Commercial Managed Care - HMO | Source: Ambulatory Visit | Attending: Nurse Practitioner | Admitting: Nurse Practitioner

## 2022-11-02 ENCOUNTER — Other Ambulatory Visit: Payer: Self-pay | Admitting: Nurse Practitioner

## 2022-11-02 DIAGNOSIS — R928 Other abnormal and inconclusive findings on diagnostic imaging of breast: Secondary | ICD-10-CM

## 2022-11-02 DIAGNOSIS — R921 Mammographic calcification found on diagnostic imaging of breast: Secondary | ICD-10-CM

## 2022-11-17 ENCOUNTER — Ambulatory Visit: Payer: Self-pay | Admitting: Cardiology

## 2022-12-15 ENCOUNTER — Ambulatory Visit: Payer: Commercial Managed Care - HMO | Admitting: Nurse Practitioner

## 2022-12-15 DIAGNOSIS — Z23 Encounter for immunization: Secondary | ICD-10-CM

## 2022-12-15 NOTE — Progress Notes (Signed)
2nd Dose of Shingrix given today.  Tolerated well. No immediate adverse reactions noted. Educated and discussed red flag symptoms and to call 911 or go to emergency room with signs of anaphylaxis.  Take OTC tylenol as needed for expected side effects such as muscle aches or fever.  Patient given the chance to ask all questions and discussed answers.  RTC as needed.   

## 2023-01-16 ENCOUNTER — Encounter: Payer: Self-pay | Admitting: Nurse Practitioner

## 2023-01-16 ENCOUNTER — Ambulatory Visit (INDEPENDENT_AMBULATORY_CARE_PROVIDER_SITE_OTHER): Payer: BLUE CROSS/BLUE SHIELD | Admitting: Nurse Practitioner

## 2023-01-16 VITALS — BP 134/88 | HR 87 | Temp 98.5°F | Ht 63.5 in | Wt 191.0 lb

## 2023-01-16 DIAGNOSIS — I10 Essential (primary) hypertension: Secondary | ICD-10-CM | POA: Diagnosis not present

## 2023-01-16 DIAGNOSIS — Z01419 Encounter for gynecological examination (general) (routine) without abnormal findings: Secondary | ICD-10-CM | POA: Diagnosis not present

## 2023-01-16 DIAGNOSIS — Z1211 Encounter for screening for malignant neoplasm of colon: Secondary | ICD-10-CM

## 2023-01-16 DIAGNOSIS — Z13228 Encounter for screening for other metabolic disorders: Secondary | ICD-10-CM

## 2023-01-16 DIAGNOSIS — Z1159 Encounter for screening for other viral diseases: Secondary | ICD-10-CM

## 2023-01-16 MED ORDER — AMLODIPINE BESYLATE 10 MG PO TABS: ORAL_TABLET | ORAL | 1 refills | Status: AC

## 2023-01-16 MED ORDER — LOSARTAN POTASSIUM 100 MG PO TABS: 100.0000 mg | ORAL_TABLET | Freq: Every day | ORAL | 1 refills | Status: AC

## 2023-01-16 NOTE — Progress Notes (Unsigned)
I,Sheena H Holbrook,acting as a Education administrator for Minette Brine, FNP.,have documented all relevant documentation on the behalf of Minette Brine, FNP,as directed by  Minette Brine, FNP while in the presence of Minette Brine, Van.    Subjective:     Patient ID: Jenna Greene , female    DOB: 15-Apr-1971 , 52 y.o.   MRN: [id number]   Chief Complaint  Patient presents with   Establish Care    HPI  Patient presents today to establish care. The last time she seen him was last year. She works housekeeping. She is here with her husband. She has 39 y/o son, 5 y/o daughter, 5 y/o daughter and 80 y/o son all children are healthy.   PMH - for more than 12 years. Losartan and HCTZ are the only medications she has taken in the past.   Patient would like referral to gynecology for PAP. She had a mammogram in November 2023 and has to repeat in may 2024. She has not had a menstrual cycle in several months November was last time.  Patient has no other concerns today. Patient would like to discuss taking Aspirin 31m.   [facility]      Past Medical History:  Diagnosis Date   GERD (gastroesophageal reflux disease)    Hypertension    Medical history non-contributory      Family History  Problem Relation Age of Onset   Hypertension Mother      Current Outpatient Medications:    amLODipine (NORVASC) 10 MG tablet, 1 tab(s) orally once a day for 90 days, Disp: 90 tablet, Rfl: 1   folic acid (FOLVITE) 1 MG tablet, Take 1 tablet by mouth daily. (Patient not taking: Reported on 01/16/2023), Disp: , Rfl:    losartan (COZAAR) 100 MG tablet, Take 1 tablet (100 mg total) by mouth daily., Disp: 90 tablet, Rfl: 1   Allergies  Allergen Reactions   Pork-Derived Products Other (See Comments)     Review of Systems  Constitutional: Negative.   Respiratory: Negative.    Cardiovascular: Negative.   Neurological: Negative.   Psychiatric/Behavioral: Negative.    All other systems reviewed  and are negative.    Today's Vitals   01/16/23 1535  BP: 134/88  Pulse: 87  Temp: 98.5 F (36.9 C)  TempSrc: Oral  SpO2: 95%  Weight: 191 lb (86.6 kg)  Height: 5' 3.5" (1.613 m)   Body mass index is 33.3 kg/m.   Objective:  Physical Exam Vitals reviewed.  Constitutional:      General: She is not in acute distress.    Appearance: Normal appearance. She is well-developed. She is obese.  HENT:     Head: Normocephalic and atraumatic.  Eyes:     Pupils: Pupils are equal, round, and reactive to light.  Cardiovascular:     Rate and Rhythm: Normal rate and regular rhythm.     Pulses: Normal pulses.     Heart sounds: Normal heart sounds. No murmur heard. Pulmonary:     Effort: Pulmonary effort is normal. No respiratory distress.     Breath sounds: Normal breath sounds. No wheezing.  Musculoskeletal:        General: Normal range of motion.  Skin:    General: Skin is warm and dry.     Capillary Refill: Capillary refill takes less than 2 seconds.  Neurological:     General: No focal deficit present.     Mental Status: She is alert and oriented to  person, place, and time.     Cranial Nerves: No cranial nerve deficit.  Psychiatric:        Mood and Affect: Mood normal.         Assessment And Plan:     1. Essential hypertension Comments: BP is fairly controlled.  This is her first visit to our office.  Advised to limit intake of high salt foods and continue to stay with well-hydrated with water. - losartan (COZAAR) 100 MG tablet; Take 1 tablet (100 mg total) by mouth daily.  Dispense: 90 tablet; Refill: 1 - amLODipine (NORVASC) 10 MG tablet; 1 tab(s) orally once a day for 90 days  Dispense: 90 tablet; Refill: 1  2. Encounter for gynecological examination Comments: She would like to see a GYN for her Pap referral will be made. - Ambulatory referral to Gynecology  3. Encounter for screening colonoscopy According to USPTF Colorectal cancer Screening guidelines. Colonoscopy  is recommended every 10 years, starting at age 48 years. Will refer to GI for colon cancer screening. - Ambulatory referral to Gastroenterology  4. Encounter for hepatitis C screening test for low risk patient Will check Hepatitis C screening due to recent recommendations to screen all adults 18 years and older - Hepatitis C antibody  5. Encounter for screening for metabolic disorder - Hemoglobin A1c - Lipid panel - CMP14+EGFR     Patient was given opportunity to ask questions. Patient verbalized understanding of the plan and was able to repeat key elements of the plan. All questions were answered to their satisfaction.  Minette Brine, FNP   I, Minette Brine, FNP, have reviewed all documentation for this visit. The documentation on 01/16/23 for the exam, diagnosis, procedures, and orders are all accurate and complete.   IF YOU HAVE BEEN REFERRED TO A SPECIALIST, IT MAY TAKE 1-2 WEEKS TO SCHEDULE/PROCESS THE REFERRAL. IF YOU HAVE NOT HEARD FROM US/SPECIALIST IN TWO WEEKS, PLEASE GIVE [place] A CALL AT [phone].   THE PATIENT IS ENCOURAGED TO PRACTICE SOCIAL DISTANCING DUE TO THE COVID-19 PANDEMIC.

## 2023-01-16 NOTE — Patient Instructions (Signed)
Hypertension, Adult High blood pressure (hypertension) is when the force of blood pumping through the arteries is too strong. The arteries are the blood vessels that carry blood from the heart throughout the body. Hypertension forces the heart to work harder to pump blood and may cause arteries to become narrow or stiff. Untreated or uncontrolled hypertension can lead to a heart attack, heart failure, a stroke, kidney disease, and other problems. A blood pressure reading consists of a higher number over a lower number. Ideally, your blood pressure should be below 120/80. The first ("top") number is called the systolic pressure. It is a measure of the pressure in your arteries as your heart beats. The second ("bottom") number is called the diastolic pressure. It is a measure of the pressure in your arteries as the heart relaxes. What are the causes? The exact cause of this condition is not known. There are some conditions that result in high blood pressure. What increases the risk? Certain factors may make you more likely to develop high blood pressure. Some of these risk factors are under your control, including: Smoking. Not getting enough exercise or physical activity. Being overweight. Having too much fat, sugar, calories, or salt (sodium) in your diet. Drinking too much alcohol. Other risk factors include: Having a personal history of heart disease, diabetes, high cholesterol, or kidney disease. Stress. Having a family history of high blood pressure and high cholesterol. Having obstructive sleep apnea. Age. The risk increases with age. What are the signs or symptoms? High blood pressure may not cause symptoms. Very high blood pressure (hypertensive crisis) may cause: Headache. Fast or irregular heartbeats (palpitations). Shortness of breath. Nosebleed. Nausea and vomiting. Vision changes. Severe chest pain, dizziness, and seizures. How is this diagnosed? This condition is diagnosed by  measuring your blood pressure while you are seated, with your arm resting on a flat surface, your legs uncrossed, and your feet flat on the floor. The cuff of the blood pressure monitor will be placed directly against the skin of your upper arm at the level of your heart. Blood pressure should be measured at least twice using the same arm. Certain conditions can cause a difference in blood pressure between your right and left arms. If you have a high blood pressure reading during one visit or you have normal blood pressure with other risk factors, you may be asked to: Return on a different day to have your blood pressure checked again. Monitor your blood pressure at home for 1 week or longer. If you are diagnosed with hypertension, you may have other blood or imaging tests to help your health care provider understand your overall risk for other conditions. How is this treated? This condition is treated by making healthy lifestyle changes, such as eating healthy foods, exercising more, and reducing your alcohol intake. You may be referred for counseling on a healthy diet and physical activity. Your health care provider may prescribe medicine if lifestyle changes are not enough to get your blood pressure under control and if: Your systolic blood pressure is above 130. Your diastolic blood pressure is above 80. Your personal target blood pressure may vary depending on your medical conditions, your age, and other factors. Follow these instructions at home: Eating and drinking  Eat a diet that is high in fiber and potassium, and low in sodium, added sugar, and fat. An example of this eating plan is called the DASH diet. DASH stands for Dietary Approaches to Stop Hypertension. To eat this way: Eat   plenty of fresh fruits and vegetables. Try to fill one half of your plate at each meal with fruits and vegetables. Eat whole grains, such as whole-wheat pasta, brown rice, or whole-grain bread. Fill about one  fourth of your plate with whole grains. Eat or drink low-fat dairy products, such as skim milk or low-fat yogurt. Avoid fatty cuts of meat, processed or cured meats, and poultry with skin. Fill about one fourth of your plate with lean proteins, such as fish, chicken without skin, beans, eggs, or tofu. Avoid pre-made and processed foods. These tend to be higher in sodium, added sugar, and fat. Reduce your daily sodium intake. Many people with hypertension should eat less than 1,500 mg of sodium a day. Do not drink alcohol if: Your health care provider tells you not to drink. You are pregnant, may be pregnant, or are planning to become pregnant. If you drink alcohol: Limit how much you have to: 0-1 drink a day for women. 0-2 drinks a day for men. Know how much alcohol is in your drink. In the [place] one drink equals one 12 oz bottle of beer (355 mL), one 5 oz glass of wine (148 mL), or one 1 oz glass of hard liquor (44 mL). Lifestyle  Work with your health care provider to maintain a healthy body weight or to lose weight. Ask what an ideal weight is for you. Get at least 30 minutes of exercise that causes your heart to beat faster (aerobic exercise) most days of the week. Activities may include walking, swimming, or biking. Include exercise to strengthen your muscles (resistance exercise), such as Pilates or lifting weights, as part of your weekly exercise routine. Try to do these types of exercises for 30 minutes at least 3 days a week. Do not use any products that contain nicotine or tobacco. These products include cigarettes, chewing tobacco, and vaping devices, such as e-cigarettes. If you need help quitting, ask your health care provider. Monitor your blood pressure at home as told by your health care provider. Keep all follow-up visits. This is important. Medicines Take over-the-counter and prescription medicines only as told by your health care provider. Follow directions carefully. Blood  pressure medicines must be taken as prescribed. Do not skip doses of blood pressure medicine. Doing this puts you at risk for problems and can make the medicine less effective. Ask your health care provider about side effects or reactions to medicines that you should watch for. Contact a health care provider if you: Think you are having a reaction to a medicine you are taking. Have headaches that keep coming back (recurring). Feel dizzy. Have swelling in your ankles. Have trouble with your vision. Get help right away if you: Develop a severe headache or confusion. Have unusual weakness or numbness. Feel faint. Have severe pain in your chest or abdomen. Vomit repeatedly. Have trouble breathing. These symptoms may be an emergency. Get help right away. Call 911. Do not wait to see if the symptoms will go away. Do not drive yourself to the hospital. Summary Hypertension is when the force of blood pumping through your arteries is too strong. If this condition is not controlled, it may put you at risk for serious complications. Your personal target blood pressure may vary depending on your medical conditions, your age, and other factors. For most people, a normal blood pressure is less than 120/80. Hypertension is treated with lifestyle changes, medicines, or a combination of both. Lifestyle changes include losing weight, eating a healthy,   low-sodium diet, exercising more, and limiting alcohol. This information is not intended to replace advice given to you by your health care provider. Make sure you discuss any questions you have with your health care provider. Document Revised: 09/28/2021 Document Reviewed: 09/28/2021 Elsevier Patient Education  2023 [employer]  

## 2023-01-17 LAB — CMP14+EGFR
ALT: 14 IU/L (ref 0–32)
AST: 14 IU/L (ref 0–40)
Albumin/Globulin Ratio: 2.1 (ref 1.2–2.2)
Albumin: 4.9 g/dL (ref 3.8–4.9)
Alkaline Phosphatase: 104 IU/L (ref 44–121)
BUN/Creatinine Ratio: 8 — ABNORMAL LOW (ref 9–23)
BUN: 6 mg/dL (ref 6–24)
Bilirubin Total: 0.4 mg/dL (ref 0.0–1.2)
CO2: 25 mmol/L (ref 20–29)
Calcium: 9.5 mg/dL (ref 8.7–10.2)
Chloride: 104 mmol/L (ref 96–106)
Creatinine, Ser: 0.74 mg/dL (ref 0.57–1.00)
Globulin, Total: 2.3 g/dL (ref 1.5–4.5)
Glucose: 91 mg/dL (ref 70–99)
Potassium: 4.1 mmol/L (ref 3.5–5.2)
Sodium: 143 mmol/L (ref 134–144)
Total Protein: 7.2 g/dL (ref 6.0–8.5)
eGFR: 98 mL/min/{1.73_m2} (ref >59)

## 2023-01-17 LAB — LIPID PANEL
Chol/HDL Ratio: 2.5 ratio (ref 0.0–4.4)
Cholesterol, Total: 146 mg/dL (ref 100–199)
HDL: 58 mg/dL (ref >39)
LDL Chol Calc (NIH): 75 mg/dL (ref 0–99)
Triglycerides: 64 mg/dL (ref 0–149)
VLDL Cholesterol Cal: 13 mg/dL (ref 5–40)

## 2023-01-17 LAB — HEPATITIS C ANTIBODY: Hep C Virus Ab: NONREACTIVE

## 2023-01-17 LAB — HEMOGLOBIN A1C
Est. average glucose Bld gHb Est-mCnc: 120 mg/dL
Hgb A1c MFr Bld: 5.8 % — ABNORMAL HIGH (ref 4.8–5.6)

## 2023-01-27 ENCOUNTER — Encounter: Payer: Self-pay | Admitting: Nurse Practitioner

## 2023-03-13 ENCOUNTER — Telehealth: Payer: Self-pay

## 2023-03-13 NOTE — Telephone Encounter (Signed)
Received fax from [facility] indicating they were unable to reach the patient to schedule an office visit. Referral has been closed.   FYI - thanks!

## 2023-03-13 NOTE — Telephone Encounter (Signed)
LVM to return call.

## 2023-04-27 ENCOUNTER — Encounter: Payer: BLUE CROSS/BLUE SHIELD | Admitting: Nurse Practitioner

## 2023-05-04 ENCOUNTER — Ambulatory Visit
Admission: RE | Admit: 2023-05-04 | Discharge: 2023-05-04 | Disposition: A | Discharge status: Home / Self Care | Payer: Self-pay | Source: Ambulatory Visit | Attending: Nurse Practitioner | Admitting: Nurse Practitioner

## 2023-05-04 DIAGNOSIS — R921 Mammographic calcification found on diagnostic imaging of breast: Secondary | ICD-10-CM
